# Patient Record
Sex: Female | Born: 1965 | Race: White | Hispanic: No | Marital: Married | Smoking: Never smoker
Health system: Southern US, Community
[De-identification: ages and names within clinical notes are randomized; demographics above are authoritative.]

## PROBLEM LIST (undated history)

## (undated) DIAGNOSIS — S46219A Strain of muscle, fascia and tendon of other parts of biceps, unspecified arm, initial encounter: Secondary | ICD-10-CM

## (undated) DIAGNOSIS — Z0000 Encounter for general adult medical examination without abnormal findings: Secondary | ICD-10-CM

## (undated) DIAGNOSIS — Z124 Encounter for screening for malignant neoplasm of cervix: Secondary | ICD-10-CM

## (undated) DIAGNOSIS — T7840XA Allergy, unspecified, initial encounter: Secondary | ICD-10-CM

## (undated) DIAGNOSIS — Z8619 Personal history of other infectious and parasitic diseases: Principal | ICD-10-CM

## (undated) DIAGNOSIS — E039 Hypothyroidism, unspecified: Secondary | ICD-10-CM

## (undated) DIAGNOSIS — E785 Hyperlipidemia, unspecified: Secondary | ICD-10-CM

## (undated) DIAGNOSIS — G4700 Insomnia, unspecified: Secondary | ICD-10-CM

## (undated) DIAGNOSIS — M1990 Unspecified osteoarthritis, unspecified site: Secondary | ICD-10-CM

## (undated) DIAGNOSIS — R5383 Other fatigue: Secondary | ICD-10-CM

## (undated) DIAGNOSIS — Z87898 Personal history of other specified conditions: Secondary | ICD-10-CM

## (undated) DIAGNOSIS — M25561 Pain in right knee: Secondary | ICD-10-CM

## (undated) DIAGNOSIS — M79669 Pain in unspecified lower leg: Secondary | ICD-10-CM

## (undated) DIAGNOSIS — B351 Tinea unguium: Secondary | ICD-10-CM

## (undated) DIAGNOSIS — K219 Gastro-esophageal reflux disease without esophagitis: Secondary | ICD-10-CM

## (undated) DIAGNOSIS — Z8709 Personal history of other diseases of the respiratory system: Secondary | ICD-10-CM

## (undated) DIAGNOSIS — M25562 Pain in left knee: Secondary | ICD-10-CM

## (undated) DIAGNOSIS — Z780 Asymptomatic menopausal state: Secondary | ICD-10-CM

## (undated) DIAGNOSIS — H6691 Otitis media, unspecified, right ear: Secondary | ICD-10-CM

## (undated) DIAGNOSIS — N951 Menopausal and female climacteric states: Secondary | ICD-10-CM

## (undated) HISTORY — DX: Pain in unspecified lower leg: M79.669

## (undated) HISTORY — DX: Strain of muscle, fascia and tendon of other parts of biceps, unspecified arm, initial encounter: S46.219A

## (undated) HISTORY — DX: Insomnia, unspecified: G47.00

## (undated) HISTORY — DX: Personal history of other diseases of the respiratory system: Z87.09

## (undated) HISTORY — DX: Menopausal and female climacteric states: N95.1

## (undated) HISTORY — DX: Hypothyroidism, unspecified: E03.9

## (undated) HISTORY — DX: Other fatigue: R53.83

## (undated) HISTORY — DX: Pain in left knee: M25.562

## (undated) HISTORY — DX: Encounter for screening for malignant neoplasm of cervix: Z12.4

## (undated) HISTORY — DX: Encounter for general adult medical examination without abnormal findings: Z00.00

## (undated) HISTORY — DX: Unspecified osteoarthritis, unspecified site: M19.90

## (undated) HISTORY — DX: Hyperlipidemia, unspecified: E78.5

## (undated) HISTORY — DX: Personal history of other specified conditions: Z87.898

## (undated) HISTORY — DX: Gastro-esophageal reflux disease without esophagitis: K21.9

## (undated) HISTORY — DX: Tinea unguium: B35.1

## (undated) HISTORY — DX: Allergy, unspecified, initial encounter: T78.40XA

## (undated) HISTORY — DX: Personal history of other infectious and parasitic diseases: Z86.19

## (undated) HISTORY — DX: Otitis media, unspecified, right ear: H66.91

## (undated) HISTORY — DX: Asymptomatic menopausal state: Z78.0

## (undated) HISTORY — DX: Pain in right knee: M25.561

---

## 1998-07-21 ENCOUNTER — Inpatient Hospital Stay (HOSPITAL_COMMUNITY): Admission: AD | Admit: 1998-07-21 | Discharge: 1998-07-24 | Payer: Self-pay | Admitting: Obstetrics and Gynecology

## 1998-07-26 ENCOUNTER — Encounter (HOSPITAL_COMMUNITY): Admission: RE | Admit: 1998-07-26 | Discharge: 1998-10-24 | Payer: Self-pay | Admitting: *Deleted

## 1999-03-12 ENCOUNTER — Other Ambulatory Visit: Admission: RE | Admit: 1999-03-12 | Discharge: 1999-03-12 | Payer: Self-pay | Admitting: Obstetrics and Gynecology

## 2000-03-03 ENCOUNTER — Other Ambulatory Visit: Admission: RE | Admit: 2000-03-03 | Discharge: 2000-03-03 | Payer: Self-pay | Admitting: Obstetrics and Gynecology

## 2000-10-11 ENCOUNTER — Inpatient Hospital Stay (HOSPITAL_COMMUNITY): Admission: AD | Admit: 2000-10-11 | Discharge: 2000-10-13 | Payer: Self-pay | Admitting: Obstetrics and Gynecology

## 2000-10-16 ENCOUNTER — Encounter: Admission: RE | Admit: 2000-10-16 | Discharge: 2000-11-15 | Payer: Self-pay | Admitting: Obstetrics and Gynecology

## 2002-02-05 ENCOUNTER — Other Ambulatory Visit: Admission: RE | Admit: 2002-02-05 | Discharge: 2002-02-05 | Payer: Self-pay | Admitting: Obstetrics and Gynecology

## 2003-03-10 ENCOUNTER — Other Ambulatory Visit: Admission: RE | Admit: 2003-03-10 | Discharge: 2003-03-10 | Payer: Self-pay | Admitting: Obstetrics and Gynecology

## 2003-10-19 ENCOUNTER — Inpatient Hospital Stay (HOSPITAL_COMMUNITY): Admission: AD | Admit: 2003-10-19 | Discharge: 2003-10-22 | Payer: Self-pay | Admitting: Obstetrics and Gynecology

## 2003-10-23 ENCOUNTER — Encounter: Admission: RE | Admit: 2003-10-23 | Discharge: 2003-11-22 | Payer: Self-pay | Admitting: Obstetrics and Gynecology

## 2010-11-30 ENCOUNTER — Ambulatory Visit (INDEPENDENT_AMBULATORY_CARE_PROVIDER_SITE_OTHER): Payer: BC Managed Care – PPO | Admitting: Family Medicine

## 2010-11-30 ENCOUNTER — Encounter: Payer: Self-pay | Admitting: Family Medicine

## 2010-11-30 DIAGNOSIS — G4700 Insomnia, unspecified: Secondary | ICD-10-CM

## 2010-11-30 DIAGNOSIS — M25561 Pain in right knee: Secondary | ICD-10-CM

## 2010-11-30 DIAGNOSIS — Z87898 Personal history of other specified conditions: Secondary | ICD-10-CM

## 2010-11-30 DIAGNOSIS — Z0000 Encounter for general adult medical examination without abnormal findings: Secondary | ICD-10-CM

## 2010-11-30 DIAGNOSIS — R5383 Other fatigue: Secondary | ICD-10-CM

## 2010-11-30 DIAGNOSIS — M25562 Pain in left knee: Secondary | ICD-10-CM

## 2010-11-30 DIAGNOSIS — N951 Menopausal and female climacteric states: Secondary | ICD-10-CM

## 2010-11-30 DIAGNOSIS — M2550 Pain in unspecified joint: Secondary | ICD-10-CM

## 2010-11-30 DIAGNOSIS — M25569 Pain in unspecified knee: Secondary | ICD-10-CM

## 2010-11-30 DIAGNOSIS — Z8619 Personal history of other infectious and parasitic diseases: Secondary | ICD-10-CM

## 2010-11-30 DIAGNOSIS — Z780 Asymptomatic menopausal state: Secondary | ICD-10-CM | POA: Insufficient documentation

## 2010-11-30 DIAGNOSIS — S46219A Strain of muscle, fascia and tendon of other parts of biceps, unspecified arm, initial encounter: Secondary | ICD-10-CM

## 2010-11-30 DIAGNOSIS — E663 Overweight: Secondary | ICD-10-CM

## 2010-11-30 DIAGNOSIS — Z8709 Personal history of other diseases of the respiratory system: Secondary | ICD-10-CM

## 2010-11-30 DIAGNOSIS — S43499A Other sprain of unspecified shoulder joint, initial encounter: Secondary | ICD-10-CM

## 2010-11-30 HISTORY — DX: Personal history of other specified conditions: Z87.898

## 2010-11-30 HISTORY — DX: Personal history of other infectious and parasitic diseases: Z86.19

## 2010-11-30 HISTORY — DX: Asymptomatic menopausal state: Z78.0

## 2010-11-30 HISTORY — DX: Personal history of other diseases of the respiratory system: Z87.09

## 2010-11-30 HISTORY — DX: Insomnia, unspecified: G47.00

## 2010-11-30 HISTORY — DX: Pain in left knee: M25.562

## 2010-11-30 HISTORY — DX: Other fatigue: R53.83

## 2010-11-30 HISTORY — DX: Menopausal and female climacteric states: N95.1

## 2010-11-30 HISTORY — DX: Strain of muscle, fascia and tendon of other parts of biceps, unspecified arm, initial encounter: S46.219A

## 2010-11-30 HISTORY — DX: Pain in right knee: M25.561

## 2010-11-30 MED ORDER — TEMAZEPAM 7.5 MG PO CAPS: ORAL_CAPSULE | ORAL | Status: DC

## 2010-11-30 MED ORDER — CALCIUM & MAGNESIUM CARBONATES 311-232 MG PO TABS: 1.0000 | ORAL_TABLET | Freq: Two times a day (BID) | ORAL | Status: AC

## 2010-11-30 NOTE — Assessment & Plan Note (Signed)
Patient started struggling with symptoms in her mid 30s. Has been followed by OB/GYN and had a new practitioner in [place] at the present time. They induced menses in September 2011 she then did not have another one until February 2012 and the skin came off naturally. She was having a lot of irritability and frequent hot flashes until she started with ultimate medication off by Greig Castilla less than and she says although symptoms have resolved. She may choose to switch her GYN care in the future but has not done so thus far is encouraged to try and get regular sleep which will be adjusted as well as deep small frequent meals with lean proteins and complex carbs

## 2010-11-30 NOTE — Assessment & Plan Note (Signed)
Patient has been since traveling with knee pain for the most part since December of 2011. In December she underwent a rigorous workout routine and weight loss effort actually lost quite a bit of weight but has not had improvement in the pain in her knee since that time. Of note the right is significantly worse than the left and the pain is most notable at this point anterior lesion the right knee and hurts worse with going upstairs in any other activity. She denies any obvious swelling redness or deformity she denies any obvious injury. She is in agreement to apply Aspercreme and ice twice daily take Aleve to 20 mg daily and she is referred to physical therapy for further treatment would recommend an x-ray of the right knee due to the symptoms being worse there than anywhere else

## 2010-11-30 NOTE — Progress Notes (Signed)
Kendra Bailey [id number] [birth year] 11/30/2010      Progress Note-Follow Up  Subjective  Chief Complaint  Chief Complaint  Patient presents with  . Establish Care    new patient    HPI  Patient is a 45 year old Caucasian female in today for new patient appointment. She generally is in good health but is offering some concerns today. In December of 2011 she started a vigorous workout routine antidiabetic resulted in some good weight loss. Unfortunately in the 5-6 months since then she's been struggling with persistent right bicep pain and bilateral knee pain right worse then left. She denies ever noting any redness, swelling, acute injury, deformity. She is less sore right bicep the pain" this appears never fully disappeared. She did use Aleve twice daily 2 tablets at a time for 10 days and approximately temporally only to have the pain occur. The right knee is worse when going up stairs and going down or with any other activity and the pain is located largely over the patella and the more superficial level at this point previously felt somewhat deeper. She is interested in further evaluation at this time. She mostly complains of poor sleep. She reports she falls asleep well but has trouble maintaining sleep generally waking up at 3 AM and approximately 5 AM. She tried melatonin valerian root but only has minimal response. Lunesta with a sour metallic taste in her mouth she did not like it. Of note her 22 year old mother who is not significantly overweight does struggle with severe enough apnea 2 resulting cardiomegaly and possibly valvular heart disease. Patient herself denies snoring or waking up with headaches. She reports being perimenopausal presently sees OB/GYN [place]. They actually induced a period in September 2011 and she did not have another one done February 2012 which he naturally has not had one since she was struggling with hot flashes and irritability but began an ultimate  women's vitamin and her symptoms greatly improved.  Past Medical History  Diagnosis Date  . History of chicken pox 11/30/2010  . History of strep sore throat 11/30/2010  . Perimenopause 11/30/2010  . Insomnia 11/30/2010  . Fatigue 11/30/2010  . Biceps strain 11/30/2010  . Knee pain, bilateral 11/30/2010  . History of fibrocystic disease of breast 11/30/2010    History reviewed. No pertinent past surgical history.  Family History  Problem Relation Age of Onset  . Emphysema Father     smoker  . COPD Father 23    emphysema, smoker  . Hyperlipidemia Father   . Multiple sclerosis Sister   . ADD / ADHD Son     ADHD  . Hypertension Maternal Grandmother   . Alzheimer's disease Maternal Grandmother   . Cancer Paternal Grandmother   . Alzheimer's disease Paternal Grandfather   . Heart disease Mother     cardiomegaly, possible valvular insufficiencyt  . Hyperlipidemia Mother   . Hypertension Mother     History   Social History  . Marital Status: Single    Spouse Name: N/A    Number of Children: N/A  . Years of Education: N/A   Occupational History  . Not on file.   Social History Main Topics  . Smoking status: Never Smoker   . Smokeless tobacco: Never Used  . Alcohol Use: Yes     occasional- wine  . Drug Use: No  . Sexually Active: Yes   Other Topics Concern  . Not on file   Social History Narrative  . No narrative on file  No current outpatient prescriptions on file prior to visit.    Allergies  Allergen Reactions  . Penicillins Rash    Review of Systems  Review of Systems  Constitutional: Negative for fever, chills and malaise/fatigue.  HENT: Negative for hearing loss, nosebleeds and congestion.   Eyes: Negative for discharge.  Respiratory: Negative for cough, sputum production, shortness of breath and wheezing.   Cardiovascular: Negative for chest pain, palpitations and leg swelling.  Gastrointestinal: Negative for heartburn, nausea, vomiting, abdominal pain,  diarrhea, constipation and blood in stool.  Genitourinary: Negative for dysuria, urgency, frequency and hematuria.  Musculoskeletal: Positive for myalgias and joint pain. Negative for back pain and falls.       [Patient c/o b/l knee pain for 5-6 months now, minimal response to OTC meds and rest. R>L. C/O right biceps muscle being sore for 5-6 months since she started a vigorous exercise regimen. She does see some improvement when rests ut Skin: Negative for rash.  Neurological: Negative for dizziness, tremors, sensory change, focal weakness, loss of consciousness, weakness and headaches.  Endo/Heme/Allergies: Negative for polydipsia. Does not bruise/bleed easily.  Psychiatric/Behavioral: Negative for depression and suicidal ideas. The patient is not nervous/anxious and does not have insomnia.     Objective  BP 114/77  Pulse 73  Temp(Src) 98 F (36.7 C) (Oral)  Ht 5\' 5"  (1.651 m)  Wt 156 lb 1.9 oz (70.816 kg)  BMI 25.98 kg/m2  SpO2 98%  Physical Exam  Physical Exam  Constitutional: She is oriented to person, place, and time and well-developed, well-nourished, and in no distress. No distress.  HENT:  Head: Normocephalic and atraumatic.  Right Ear: External ear normal.  Left Ear: External ear normal.  Nose: Nose normal.  Mouth/Throat: Oropharynx is clear and moist. No oropharyngeal exudate.  Eyes: Conjunctivae are normal. Pupils are equal, round, and reactive to light. Right eye exhibits no discharge. Left eye exhibits no discharge. No scleral icterus.  Neck: Normal range of motion. Neck supple. No thyromegaly present.  Cardiovascular: Normal rate, regular rhythm, normal heart sounds and intact distal pulses.   No murmur heard. Pulmonary/Chest: Effort normal and breath sounds normal. No respiratory distress. She has no wheezes. She has no rales.  Abdominal: Soft. Bowel sounds are normal. She exhibits no distension and no mass. There is no tenderness.  Musculoskeletal: Normal range  of motion. She exhibits no edema and no tenderness.  Lymphadenopathy:    She has no cervical adenopathy.  Neurological: She is alert and oriented to person, place, and time. She has normal reflexes. No cranial nerve deficit. Coordination normal.  Skin: Skin is warm and dry. No rash noted. She is not diaphoretic.  Psychiatric: Mood, memory and affect normal.    No results found for this basename: TSH   No results found for this basename: WBC, HGB, HCT, MCV, PLT   No results found for this basename: CREATININE, BUN, NA, K, CL, CO2   No results found for this basename: ALT, AST, GGT, ALKPHOS, BILITOT   No results found for this basename: CHOL   No results found for this basename: HDL   No results found for this basename: LDLCALC   No results found for this basename: TRIG   No results found for this basename: CHOLHDL     Assessment & Plan  Perimenopause Patient started struggling with symptoms in her mid 30s. Has been followed by OB/GYN and had a new practitioner in [place] at the present time. They induced menses in September 2011 she  then did not have another one until February 2012 and the skin came off naturally. She was having a lot of irritability and frequent hot flashes until she started with ultimate medication off by Greig Castilla less than and she says although symptoms have resolved. She may choose to switch her GYN care in the future but has not done so thus far is encouraged to try and get regular sleep which will be adjusted as well as deep small frequent meals with lean proteins and complex carbs  Knee pain, bilateral Patient has been since traveling with knee pain for the most part since December of 2011. In December she underwent a rigorous workout routine and weight loss effort actually lost quite a bit of weight but has not had improvement in the pain in her knee since that time. Of note the right is significantly worse than the left and the pain is most notable at  this point anterior lesion the right knee and hurts worse with going upstairs in any other activity. She denies any obvious swelling redness or deformity she denies any obvious injury. She is in agreement to apply Aspercreme and ice twice daily take Aleve to 20 mg daily and she is referred to physical therapy for further treatment would recommend an x-ray of the right knee due to the symptoms being worse there than anywhere else  Insomnia Patient with long history of toe with insomnia. She reports roughly 5 years. She generally falls asleep without difficulty but wakes up routinely at roughly 3:00 in the morning and at what time she goes to bed. She didn't have trouble falling asleep for good 2 hours. As a result she is chronically fatigued. She is trying Lunesta past and had a metallic taste would not like to use that again. She denies any snoring. She does use melatonin n intermittently with only minimal effect. Of note her mother is only in her mid 29s and does have sleep apnea which was severe enough to cause some cardiomegaly prior to her diagnosis. At this point the patient has just been off we will start Restoril 7.5 mg 1-2 caps by mouth each bedtime she is counseled regarding good sleep hygiene including a dark quiet room no strenuous activity or stimulants in the evenings. She is further referred for a evaluation by sleep medicine and the possibility of a sleep study.  Fatigue Likely multifactorial but certainly is related to stress and poor sleep. We will evaluate her sleep and she is encouraged to eat small frequent meals with lean proteins complex carbs and to exercise on a regular basis. Check lab work including TSH and CBC for further information  Biceps strain Encouraged, Aleve daily, ice and Aspercreme bid and referred to PT for rehab and consideration of Iontophorsis.

## 2010-11-30 NOTE — Patient Instructions (Signed)
Knee Pain, General The knee is the complex joint between your thigh and your lower leg. It is made up of bones, tendons, ligaments, and cartilage. The bones that make up the knee are:  The femur in the thigh.   The tibia and fibula in the lower leg.   The patella or kneecap riding in the groove on the lower femur.  CAUSES Knee pain is a common complaint with many causes.  A few of these causes are:  Injury, such as:   A ruptured ligament or tendon injury.   Torn cartilage.    Medical conditions, such as:   Gout   Arthritis   Infections   Overuse, over training or overdoing a physical activity.  Knee pain can be minor or severe. Knee pain can accompany debilitating injury. Minor knee problems often respond well to self-care measures or get well on their own. More serious injuries may need medical intervention or even surgery. Symptoms The knee is complex. Symptoms of knee problems can vary widely. Some of the problems are:  Pain with movement and weight bearing.  Swelling and tenderness.   Buckling of the knee.   Inability to straighten or extend your knee.   Your knee locks and you cannot straighten it.  Warmth and redness with pain and fever.   Deformity or dislocation of the kneecap.   DIAGNOSIS Determining what is wrong may be very straight forward such as when there is an injury. It can also be challenging because of the complexity of the knee. Tests to make a diagnosis may include:  Your caregiver taking a history and doing a physical exam.   Routine X-rays can be used to rule out other problems. X-rays will not reveal a cartilage tear. Some injuries of the knee can be diagnosed by:   Arthroscopy a surgical technique by which a small video camera is inserted through tiny incisions on the sides of the knee. This procedure is used to examine and repair internal knee joint problems. Tiny instruments can be used during arthroscopy to repair the torn knee cartilage  (meniscus).   Arthrography is a radiology technique. A contrast liquid is directly injected into the knee joint. Internal structures of the knee joint then become visible on X-ray film.   An MRI scan is a non x-ray radiology procedure in which magnetic fields and a computer produce two- or three-dimensional images of the inside of the knee. Cartilage tears are often visible using an MRI scanner. MRI scans have largely replaced arthrography in diagnosing cartilage tears of the knee.   Blood work.   Examination of the fluid that helps to lubricate the knee joint (synovial fluid). This is done by taking a sample out using a needle and a syringe.  treatment The treatment of knee problems depends on the cause. Some of these treatments are:  Depending on the injury, proper casting, splinting, surgery or physical therapy care will be needed.   Give yourself adequate recovery time. Do not overuse your joints. If you begin to get sore during workout routines, back off. Slow down or do fewer repetitions.   For repetitive activities such as cycling or running, maintain your strength and nutrition.   Alternate muscle groups. For example if you are a weight lifter, work the upper body on one day and the lower body the next.   Either tight or weak muscles do not give the proper support for your knee. Tight or weak muscles do not absorb the stress placed   on the knee joint. Keep the muscles surrounding the knee strong.   Take care of mechanical problems.   If you have flat feet, orthotics or special shoes may help. See your caregiver if you need help.   Arch supports, sometimes with wedges on the inner or outer aspect of the heel, can help. These can shift pressure away from the side of the knee most bothered by osteoarthritis.   A brace called an "unloader" brace also may be used to help ease the pressure on the most arthritic side of the knee.   If your caregiver has prescribed crutches, braces,  wraps or ice, use as directed. The acronym for this is PRICE. This means protection, rest, ice, compression and elevation.   Nonsteroidal anti-inflammatory drugs (NSAID's), can help relieve pain. But if taken immediately after an injury, they may actually increase swelling. Take NSAID's with food in your stomach. Stop them if you develop stomach problems. Do not take these if you have a history of ulcers, stomach pain or bleeding from the bowel. Do not take without your caregiver's approval if you have problems with fluid retention, heart failure, or kidney problems.   For ongoing knee problems, physical therapy may be helpful.   Glucosamine and chondroitin are over-the-counter dietary supplements. Both may help relieve the pain of osteoarthritis in the knee. These medicines are different from the usual anti-inflammatory drugs. Glucosamine may decrease the rate of cartilage destruction.   Injections of a corticosteroid drug into your knee joint may help reduce the symptoms of an arthritis flare-up. They may provide pain relief that lasts a few months. You may have to wait a few months between injections. The injections do have a small increased risk of infection, water retention and elevated blood sugar levels.   Hyaluronic acid injected into damaged joints may ease pain and provide lubrication. These injections may work by reducing inflammation. A series of shots may give relief for as long as 6 months.   Topical painkillers. Applying certain ointments to your skin may help relieve the pain and stiffness of osteoarthritis. Ask your pharmacist for suggestions. Many over the-counter products are approved for temporary relief of arthritis pain.   In some countries, doctors often prescribe topical NSAID's for relief of chronic conditions such as arthritis and tendinitis. A review of treatment with NSAID creams found that they worked as well as oral medications but without the serious side effects.    Prevention  Maintain a healthy weight. Extra pounds put more strain on your joints.   Get strong, stay limber. Weak muscles are a common cause of knee injuries. Stretching is important. Include flexibility exercises in your workouts.   Be smart about exercise. If you have osteoarthritis, chronic knee pain or recurring injuries, you may need to change the way you exercise. This does not mean you have to stop being active. If your knees ache after jogging or playing basketball, consider switching to swimming, water aerobics or other low-impact activities, at least for a few days a week. Sometimes limiting high-impact activities will provide relief.   Make sure your shoes fit well. Choose footwear that is right for your sport.   Protect your knees. Use the proper gear for knee-sensitive activities. Use kneepads when playing volleyball or laying carpet. Buckle your seat belt every time you drive. Most shattered kneecaps occur in car accidents.   Rest when you are tired.  SEEK MEDICAL CARE IF: You have knee pain that is continual and does not seem   to be getting better.  Seek IMMEDIATE MEDICAL CARE IF:  Your knee joint feels hot to the touch and you have a high fever. MAKE SURE YOU:   Understand these instructions.   Will watch your condition.   Will get help right away if you are not doing well or get worse.  Document Released: 05/08/2007 Document Re-Released: 10/05/2009 [facility] Patient Information 2011 [place], [place].  Ice and Aspercreme up to twice a day   Start Aleve 220mg  1 tab daily with food for next 2 weeks  Will refer to PT in [facility] for eval  Will refer to Sleep specialist for insomnia

## 2010-11-30 NOTE — Assessment & Plan Note (Signed)
Patient with long history of toe with insomnia. She reports roughly 5 years. She generally falls asleep without difficulty but wakes up routinely at roughly 3:00 in the morning and at what time she goes to bed. She didn't have trouble falling asleep for good 2 hours. As a result she is chronically fatigued. She is trying Lunesta past and had a metallic taste would not like to use that again. She denies any snoring. She does use melatonin n intermittently with only minimal effect. Of note her mother is only in her mid 35s and does have sleep apnea which was severe enough to cause some cardiomegaly prior to her diagnosis. At this point the patient has just been off we will start Restoril 7.5 mg 1-2 caps by mouth each bedtime she is counseled regarding good sleep hygiene including a dark quiet room no strenuous activity or stimulants in the evenings. She is further referred for a evaluation by sleep medicine and the possibility of a sleep study.

## 2010-11-30 NOTE — Assessment & Plan Note (Signed)
Likely multifactorial but certainly is related to stress and poor sleep. We will evaluate her sleep and she is encouraged to eat small frequent meals with lean proteins complex carbs and to exercise on a regular basis. Check lab work including TSH and CBC for further information

## 2010-11-30 NOTE — Assessment & Plan Note (Signed)
Encouraged, Aleve daily, ice and Aspercreme bid and referred to PT for rehab and consideration of Iontophorsis.

## 2010-12-01 ENCOUNTER — Other Ambulatory Visit (INDEPENDENT_AMBULATORY_CARE_PROVIDER_SITE_OTHER): Payer: BC Managed Care – PPO

## 2010-12-01 DIAGNOSIS — G4700 Insomnia, unspecified: Secondary | ICD-10-CM

## 2010-12-01 DIAGNOSIS — E663 Overweight: Secondary | ICD-10-CM

## 2010-12-01 DIAGNOSIS — M2550 Pain in unspecified joint: Secondary | ICD-10-CM

## 2010-12-01 DIAGNOSIS — R5383 Other fatigue: Secondary | ICD-10-CM

## 2010-12-01 DIAGNOSIS — M25561 Pain in right knee: Secondary | ICD-10-CM

## 2010-12-01 DIAGNOSIS — M25562 Pain in left knee: Secondary | ICD-10-CM

## 2010-12-01 LAB — HEPATIC FUNCTION PANEL
ALT: 22 U/L (ref 0–35)
Alkaline Phosphatase: 51 U/L (ref 39–117)
Bilirubin, Direct: 0.1 mg/dL (ref 0.0–0.3)
Total Bilirubin: 1 mg/dL (ref 0.3–1.2)

## 2010-12-01 LAB — RENAL FUNCTION PANEL
Calcium: 9.2 mg/dL (ref 8.4–10.5)
Chloride: 103 mEq/L (ref 96–112)
Glucose, Bld: 78 mg/dL (ref 70–99)
Phosphorus: 3.6 mg/dL (ref 2.3–4.6)
Potassium: 4.7 mEq/L (ref 3.5–5.1)
Sodium: 139 mEq/L (ref 135–145)

## 2010-12-01 LAB — CBC WITH DIFFERENTIAL/PLATELET
Basophils Relative: 0.4 % (ref 0.0–3.0)
Eosinophils Absolute: 0.1 10*3/uL (ref 0.0–0.7)
Hemoglobin: 15.4 g/dL — ABNORMAL HIGH (ref 12.0–15.0)
MCHC: 35 g/dL (ref 30.0–36.0)
MCV: 90.3 fl (ref 78.0–100.0)
Monocytes Absolute: 0.4 10*3/uL (ref 0.1–1.0)
Neutro Abs: 3.1 10*3/uL (ref 1.4–7.7)
RBC: 4.88 Mil/uL (ref 3.87–5.11)
RDW: 12.5 % (ref 11.5–14.6)

## 2010-12-02 ENCOUNTER — Ambulatory Visit (HOSPITAL_BASED_OUTPATIENT_CLINIC_OR_DEPARTMENT_OTHER)
Admission: RE | Admit: 2010-12-02 | Discharge: 2010-12-02 | Disposition: A | Discharge status: Home / Self Care | Payer: BC Managed Care – PPO | Source: Ambulatory Visit | Attending: Family Medicine | Admitting: Family Medicine

## 2010-12-02 DIAGNOSIS — M25569 Pain in unspecified knee: Secondary | ICD-10-CM

## 2010-12-02 LAB — ANA: Anti Nuclear Antibody(ANA): NEGATIVE

## 2010-12-07 ENCOUNTER — Ambulatory Visit: Payer: Self-pay | Admitting: Family Medicine

## 2010-12-15 ENCOUNTER — Ambulatory Visit (INDEPENDENT_AMBULATORY_CARE_PROVIDER_SITE_OTHER): Payer: BC Managed Care – PPO | Admitting: Pulmonary Disease

## 2010-12-15 ENCOUNTER — Encounter: Payer: Self-pay | Admitting: Pulmonary Disease

## 2010-12-15 VITALS — BP 100/60 | HR 68 | Temp 97.9°F | Ht 65.0 in | Wt 159.0 lb

## 2010-12-15 DIAGNOSIS — G4700 Insomnia, unspecified: Secondary | ICD-10-CM

## 2010-12-15 NOTE — Progress Notes (Signed)
  Subjective:    Patient ID: Kendra Bailey, female    DOB: 1965-10-10, 45 y.o.   MRN: [id number]  HPI The pt is a 45y/o female who I have been asked to see for ongoing sleeping issues.  The pt describes a 45yr h/o sleep maintenance problems.  She typically goes to bed btw 845-945pm, and will usually watch tv or read in bed.  When she does turn out the lights, usually around 10pm, she gets to sleep very quickly.  She will sleep for 3-4 hrs, but then awaken, and be "totally awake with mind racing".  She will usually stay in bed and toss and turn, and it will take her to 2 hrs to fall back asleep.  She denies any snoring issues or symptoms c/w SDB, and does not have any leg jerks or RLS sx.  She will start her day around 630am, and does not feel rested upon arising.  She does not feel sleepy during the day, but rather describes fatigue/tiredness.  The pt states her weight has been up and down the last 2 yrs, but that she is net up 20lbs.     Review of Systems  Constitutional: Positive for unexpected weight change. Negative for fever.  HENT: Negative for ear pain, nosebleeds, congestion, sore throat, rhinorrhea, sneezing, trouble swallowing, dental problem, postnasal drip and sinus pressure.   Eyes: Negative for redness and itching.  Respiratory: Negative for cough, chest tightness, shortness of breath and wheezing.   Cardiovascular: Negative for palpitations and leg swelling.  Gastrointestinal: Negative for nausea and vomiting.  Genitourinary: Negative for dysuria.  Musculoskeletal: Positive for joint swelling.  Skin: Negative for rash.  Neurological: Negative for headaches.  Hematological: Does not bruise/bleed easily.  Psychiatric/Behavioral: Negative for dysphoric mood. The patient is not nervous/anxious.        Objective:   Physical Exam Constitutional:  Well developed, no acute distress  HENT:  Nares patent without discharge  Oropharynx without exudate, palate and uvula are  normal  Eyes:  Perrla, eomi, no scleral icterus  Neck:  No JVD, no TMG  Cardiovascular:  Normal rate, regular rhythm, no rubs or gallops.  No murmurs        Intact distal pulses  Pulmonary :  Normal breath sounds, no stridor or respiratory distress   No rales, rhonchi, or wheezing  Abdominal:  Soft, nondistended, bowel sounds present.  No tenderness noted.   Musculoskeletal:  No lower extremity edema noted.  Lymph Nodes:  No cervical lymphadenopathy noted  Skin:  No cyanosis noted  Neurologic:  Alert, appropriate, does not appear sleepy,  moves all 4 extremities without obvious deficit.         Assessment & Plan:

## 2010-12-15 NOTE — Patient Instructions (Signed)
Do not read or watch tv in bed.  Go to bed with the intent of going to sleep immediately Do not stay in bed more than if you cannot initiate sleep. No napping during the day, and no caffeine after 10am. Consider moving bedtime to 11pm until this issue improves. followup with me in 3 weeks to check on your progress.

## 2010-12-24 NOTE — Assessment & Plan Note (Signed)
The pt is describing sleep maintenance issues that I suspect are related to sleep hygiene, although cannot exclude an advanced sleep phase problem.  I think she needs to work on getting to bed later, establishing the bedroom as an environment for sleeping only, and to try stimulus control therapy if she cannot get back to sleep quickly.  I have reviewed this with her in detail.

## 2011-01-10 ENCOUNTER — Ambulatory Visit: Payer: BC Managed Care – PPO | Admitting: Pulmonary Disease

## 2011-08-12 ENCOUNTER — Encounter: Payer: Self-pay | Admitting: Family Medicine

## 2011-08-12 ENCOUNTER — Ambulatory Visit (INDEPENDENT_AMBULATORY_CARE_PROVIDER_SITE_OTHER): Payer: 59 | Admitting: Family Medicine

## 2011-08-12 VITALS — BP 131/75 | HR 66 | Temp 97.8°F | Ht 65.0 in | Wt 167.8 lb

## 2011-08-12 DIAGNOSIS — M79669 Pain in unspecified lower leg: Secondary | ICD-10-CM

## 2011-08-12 DIAGNOSIS — Z23 Encounter for immunization: Secondary | ICD-10-CM

## 2011-08-12 DIAGNOSIS — S46219A Strain of muscle, fascia and tendon of other parts of biceps, unspecified arm, initial encounter: Secondary | ICD-10-CM

## 2011-08-12 DIAGNOSIS — S46819A Strain of other muscles, fascia and tendons at shoulder and upper arm level, unspecified arm, initial encounter: Secondary | ICD-10-CM

## 2011-08-12 DIAGNOSIS — M25519 Pain in unspecified shoulder: Secondary | ICD-10-CM

## 2011-08-12 DIAGNOSIS — M79609 Pain in unspecified limb: Secondary | ICD-10-CM

## 2011-08-12 DIAGNOSIS — M25511 Pain in right shoulder: Secondary | ICD-10-CM

## 2011-08-12 HISTORY — DX: Pain in unspecified lower leg: M79.669

## 2011-08-12 NOTE — Progress Notes (Signed)
Patient ID: Kendra Bailey, female   DOB: October 23, 1965, 46 y.o.   MRN: [id number] RMONI KEPLINGER [id number] 1965-12-11 08/12/2011      Progress Note-Follow Up  Subjective  Chief Complaint  Chief Complaint  Patient presents with  . pain in calf muscle    right- X 1 yr ago- hurts while working out but ok while walking    HPI  Patient is a 78 Caucasian female who is in today complaining of recurrent right shoulder pain. After she was in and was having pain diagnosed with biceps tendinitis and underwent a course of physical therapy which did help. The pain resolved but then he should return to exercise the pain returns. It is not debilitating but it does occur to him comfortable. There is no redness or warmth. She's had recent injury. No numbness tingling or weakness in the hand. She also complaining of intermittent right calf pain which has been going on for about a year. She reports doing step aerobics last year and stepping up without twisting or falling and having a pain in her right posterior calf. Never any redness, swelling, warmth.  With rest this pain also resolved but has recurred once again and she started to exercise. No shortness of breath, chest pain, palpitations, symptoms radiating up or down the leg or into the foot.  Past Medical History  Diagnosis Date  . History of chicken pox 11/30/2010  . History of strep sore throat 11/30/2010  . Perimenopause 11/30/2010  . Insomnia 11/30/2010  . Fatigue 11/30/2010  . Biceps strain 11/30/2010  . Knee pain, bilateral 11/30/2010  . History of fibrocystic disease of breast 11/30/2010  . Calf pain 08/12/2011    History reviewed. No pertinent past surgical history.  Family History  Problem Relation Age of Onset  . Emphysema Father     smoker  . COPD Father 6    emphysema, smoker  . Hyperlipidemia Father   . Multiple sclerosis Sister   . ADD / ADHD Son     ADHD  . Hypertension Maternal Grandmother   . Alzheimer's disease Maternal  Grandmother   . Cancer Paternal Grandmother   . Alzheimer's disease Paternal Grandfather   . Heart disease Mother     cardiomegaly, possible valvular insufficiencyt  . Hyperlipidemia Mother   . Hypertension Mother     History   Social History  . Marital Status: Married    Spouse Name: N/A    Number of Children: 3  . Years of Education: N/A   Occupational History  . stay at home mom    Social History Main Topics  . Smoking status: Never Smoker   . Smokeless tobacco: Never Used  . Alcohol Use: Yes     occasional- wine  . Drug Use: No  . Sexually Active: Yes   Other Topics Concern  . Not on file   Social History Narrative  . No narrative on file    Current Outpatient Prescriptions on File Prior to Visit  Medication Sig Dispense Refill  . calcium & magnesium carbonates (MYLANTA) 311-232 MG per tablet Take 1 tablet by mouth 2 (two) times daily.  60 tablet  11  . fish oil-omega-3 fatty acids 1000 MG capsule Take 2 g by mouth daily.        . multivitamin (THERAGRAN) tablet Take 1 tablet by mouth daily.          Allergies  Allergen Reactions  . Penicillins Rash    Review of Systems  Review  of Systems  Constitutional: Negative for fever and malaise/fatigue.  HENT: Negative for congestion.   Eyes: Negative for discharge.  Respiratory: Negative for shortness of breath.   Cardiovascular: Negative for chest pain, palpitations and leg swelling.  Gastrointestinal: Negative for nausea, abdominal pain and diarrhea.  Genitourinary: Negative for dysuria.  Musculoskeletal: Positive for myalgias and joint pain. Negative for falls.       Recurrent right shoulder pain, recurrent right calf pain  Skin: Negative for rash.  Neurological: Negative for loss of consciousness and headaches.  Endo/Heme/Allergies: Negative for polydipsia.  Psychiatric/Behavioral: Negative for depression and suicidal ideas. The patient is not nervous/anxious and does not have insomnia.      Objective  BP 131/75  Pulse 66  Temp(Src) 97.8 F (36.6 C) (Temporal)  Ht 5\' 5"  (1.651 m)  Wt 167 lb 12.8 oz (76.114 kg)  BMI 27.92 kg/m2  SpO2 100%  Physical Exam  Physical Exam  Constitutional: She is oriented to person, place, and time and well-developed, well-nourished, and in no distress. No distress.  HENT:  Head: Normocephalic and atraumatic.  Eyes: Conjunctivae are normal.  Neck: Neck supple. No thyromegaly present.  Cardiovascular: Normal rate, regular rhythm and normal heart sounds.   No murmur heard. Pulmonary/Chest: Effort normal and breath sounds normal. She has no wheezes.  Abdominal: She exhibits no distension and no mass.  Musculoskeletal: She exhibits tenderness. She exhibits no edema.       Slightly tender with palpation over lateral right shoulder and posterior right calf  Lymphadenopathy:    She has no cervical adenopathy.  Neurological: She is alert and oriented to person, place, and time.  Skin: Skin is warm and dry. No rash noted. She is not diaphoretic.  Psychiatric: Memory, affect and judgment normal.    Lab Results  Component Value Date   TSH 2.41 12/01/2010   Lab Results  Component Value Date   WBC 5.4 12/01/2010   HGB 15.4* 12/01/2010   HCT 44.0 12/01/2010   MCV 90.3 12/01/2010   PLT 215.0 12/01/2010   Lab Results  Component Value Date   CREATININE 1.0 12/01/2010   BUN 17 12/01/2010   NA 139 12/01/2010   K 4.7 12/01/2010   CL 103 12/01/2010   CO2 31 12/01/2010   Lab Results  Component Value Date   ALT 22 12/01/2010   AST 28 12/01/2010   ALKPHOS 51 12/01/2010   BILITOT 1.0 12/01/2010      Assessment & Plan  Calf pain Recurrent since an injury during step aerobics about a year ago.He see twisting or falls when it first occurred or since. There was no redness or warmth or swelling occurred or now. She notes it is better whenever she stops exercising but when she restarts the pain recurs. It hurts with excess use any drops at night. She is requesting  further evaluation. She is referred to orthopedics at this time due to recurrent nature of injury.   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 She describes is on the lateral shoulder and worse with use. No numbness tingling or weakness in the arm. Proximal and the rest are helpful. At this point we will refer her to orthopedics for further evaluation at this time. She is encouraged to continue stretching and exercise as tolerated. May continue naproxen which does give her temporary relief he may also try a Aspercreme twice a day.

## 2011-08-12 NOTE — Assessment & Plan Note (Signed)
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 She describes is on the lateral shoulder and worse with use. No numbness tingling or weakness in the arm. Proximal and the rest are helpful. At this point we will refer her to orthopedics for further evaluation at this time. She is encouraged to continue stretching and exercise as tolerated. May continue naproxen which does give her temporary relief he may also try a Aspercreme twice a day.

## 2011-08-12 NOTE — Patient Instructions (Signed)
Shoulder Pain The shoulder is a ball and socket joint. The muscles and tendons (rotator cuff) are what keep the shoulder in its joint and stable. This collection of muscles and tendons holds in the head (ball) of the humerus (upper arm bone) in the fossa (cup) of the scapula (shoulder blade). Today no reason was found for your shoulder pain. Often pain in the shoulder may be treated conservatively with temporary immobilization. For example, holding the shoulder in one place using a sling for rest. Physical therapy may be needed if problems continue. HOME CARE INSTRUCTIONS   Apply ice to the sore area for 15 to 20 minutes, 3 to 4 times per day for the first 2 days. Put the ice in a plastic bag. Place a towel between the bag of ice and your skin.   If you have or were given a shoulder sling and straps, do not remove for as long as directed by your   caregiver or until you see a caregiver for a follow-up examination. If you need to remove it to shower or bathe, move your arm as little as possible.   Sleep on several pillows at night to lessen swelling and pain.   Only take over-the-counter or prescription medicines for pain, discomfort, or fever as directed by your caregiver.   Keep any follow-up appointments in order to avoid any type of permanent shoulder disability or chronic pain problems.  SEEK MEDICAL CARE IF:   Pain in your shoulder increases or new pain develops in your arm, hand, or fingers.   Your hand or fingers are colder than your other hand.   You do not obtain pain relief with the medications or your pain becomes worse.  SEEK IMMEDIATE MEDICAL CARE IF:   Your arm, hand, or fingers are numb or tingling.   Your arm, hand, or fingers are swollen, painful, or turn white or blue.   You develop chest pain or shortness of breath.  MAKE SURE YOU:   Understand these instructions.   Will watch your condition.   Will get help right away if you are not doing well or get worse.    Document Released: 04/20/2005 Document Revised: 03/23/2011 Document Reviewed: 11/23/2006  Continue to stretch daily and try Aspercreme twice daily [facility] Patient Information 2012 [place], [place].

## 2011-08-12 NOTE — Assessment & Plan Note (Signed)
Recurrent since an injury during step aerobics about a year ago.He see twisting or falls when it first occurred or since. There was no redness or warmth or swelling occurred or now. She notes it is better whenever she stops exercising but when she restarts the pain recurs. It hurts with excess use any drops at night. She is requesting further evaluation. She is referred to orthopedics at this time due to recurrent nature of injury.

## 2012-03-18 IMAGING — CR DG KNEE 1-2V*R*
2 series · 2 of 2 positions shown · non-contrast
Comparison: None.

CLINICAL DATA: Chronic anterior knee pain.  No specific injury
history.  .

RIGHT KNEE - 1-2 VIEW

[t knee ap right]
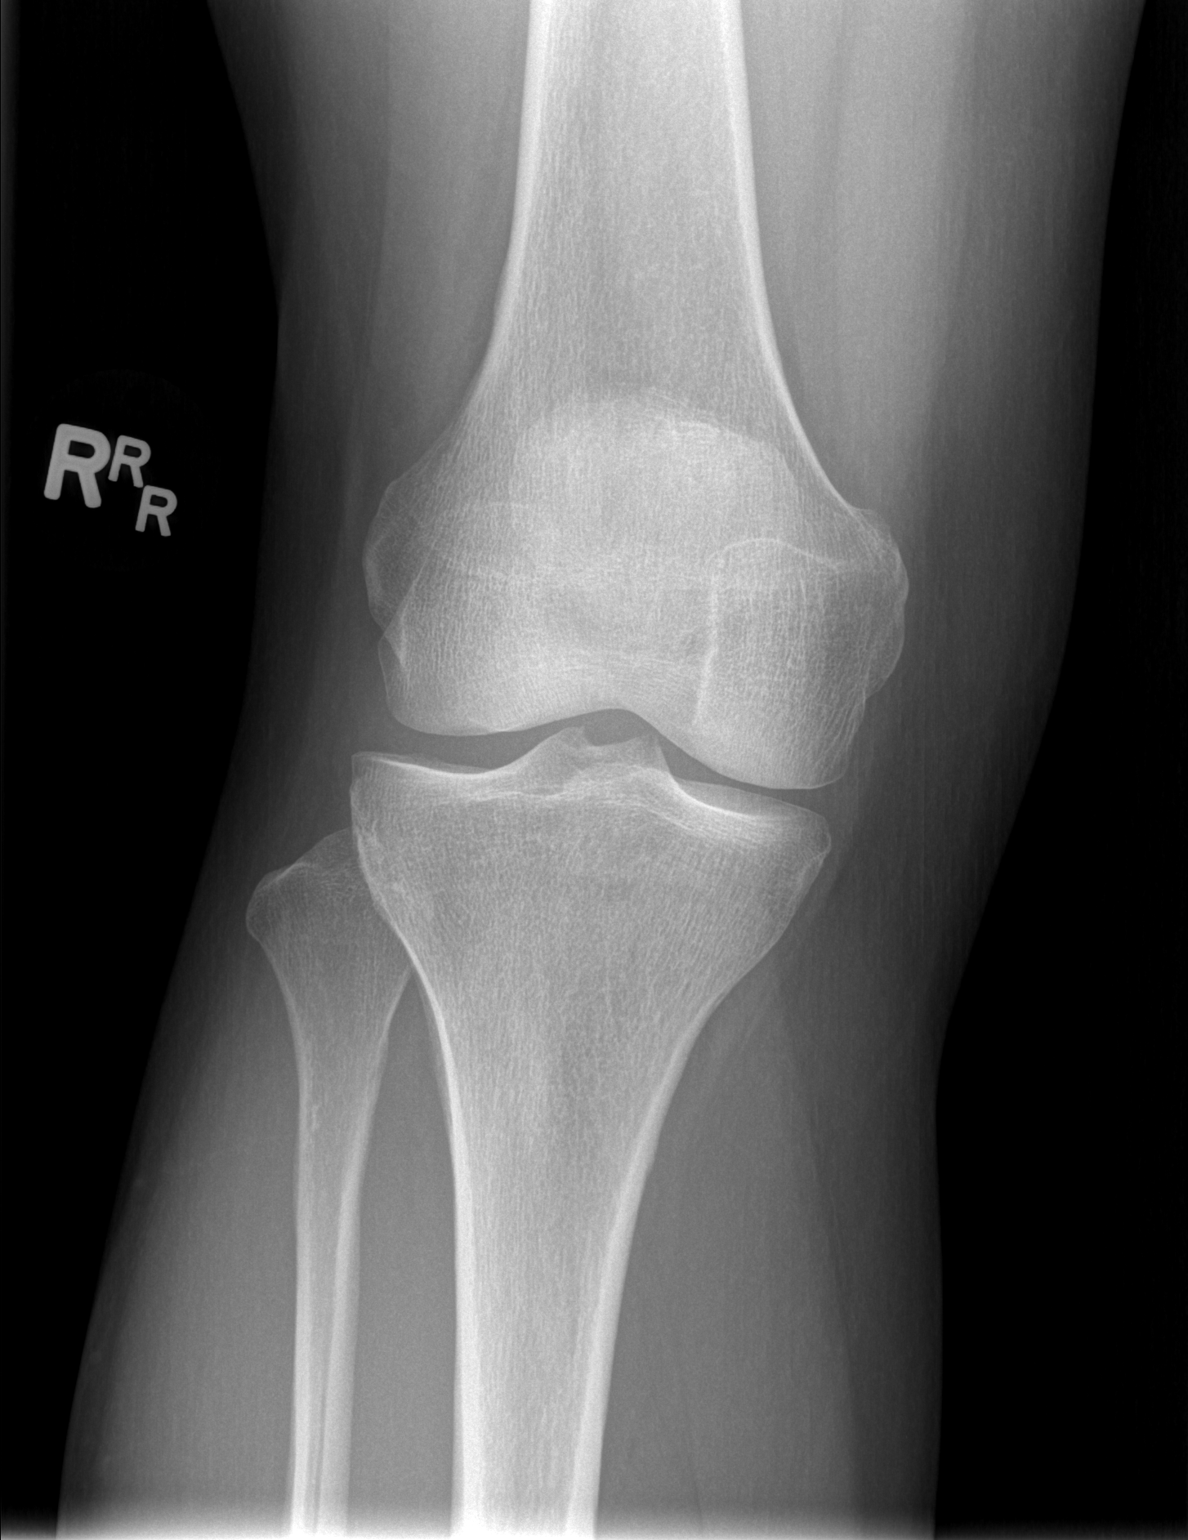

[t knee lat right]
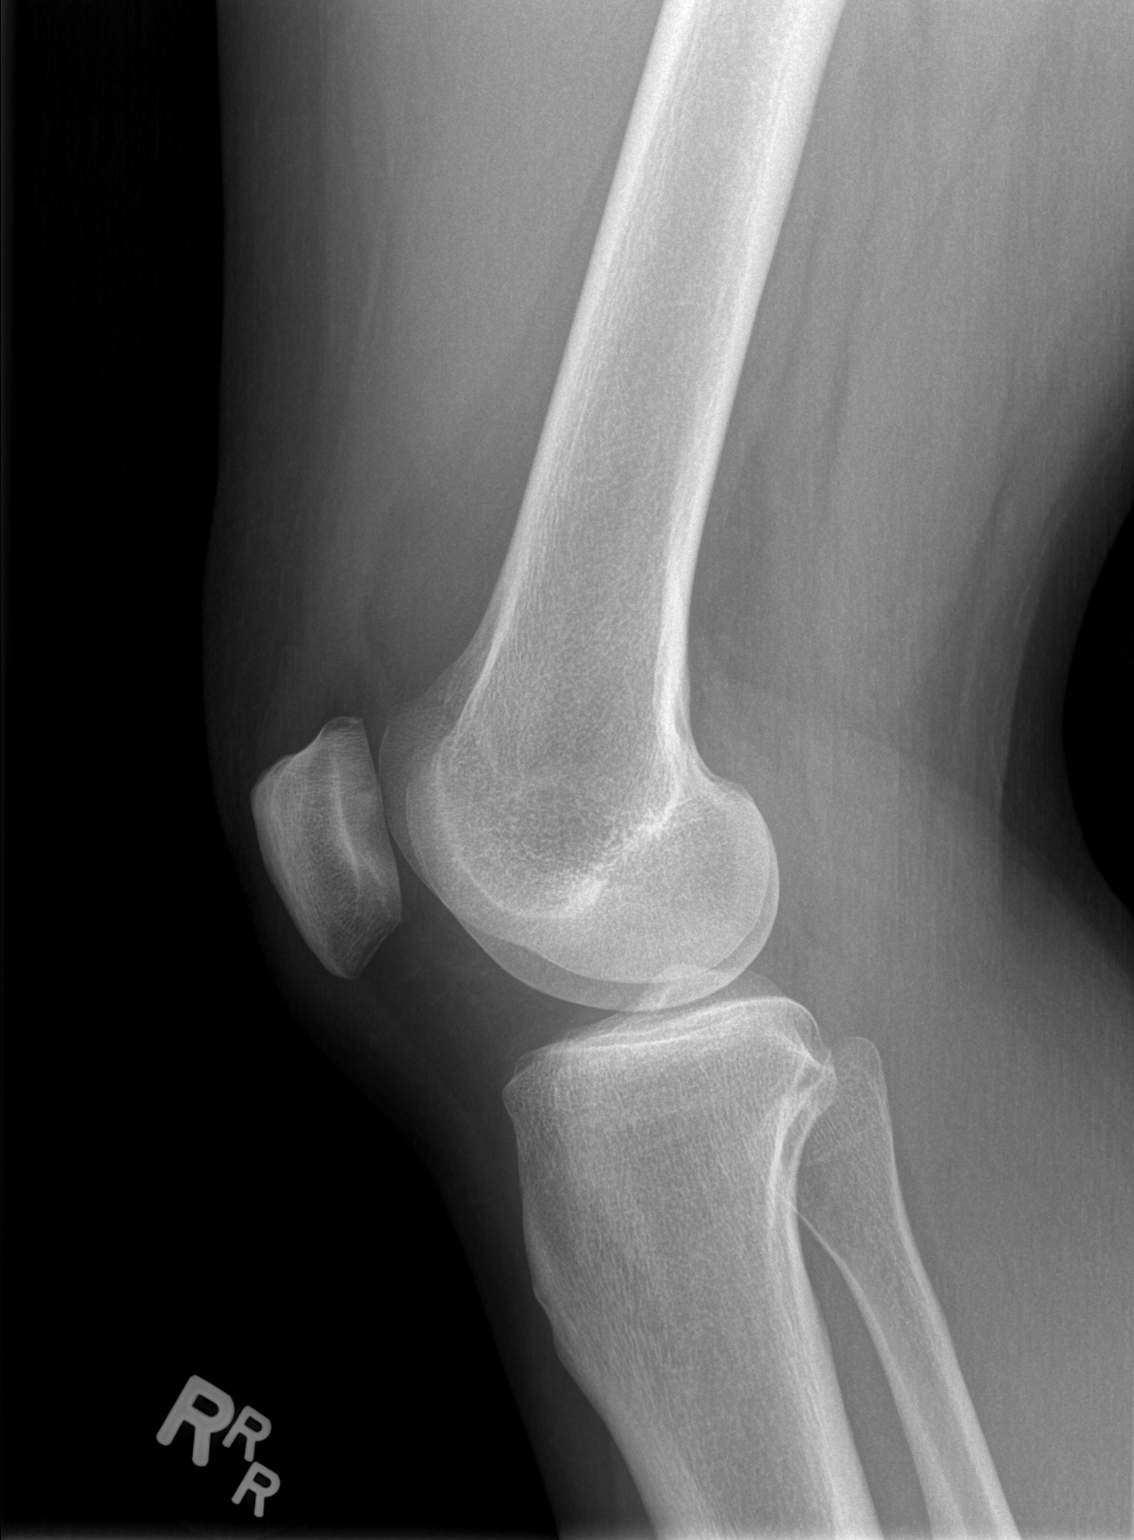

[2 of 2 positions shown; findings below may reference images not displayed]

FINDINGS: No definite joint effusion is seen. Alignment is normal.
Joint spaces are preserved.  No fracture or dislocation is evident.
No soft tissue lesions are seen.  No chondrocalcinosis is evident.
No loose body is seen.
IMPRESSION: No right knee lesion is evident.

## 2012-04-20 ENCOUNTER — Ambulatory Visit (INDEPENDENT_AMBULATORY_CARE_PROVIDER_SITE_OTHER): Payer: 59 | Admitting: Family Medicine

## 2012-04-20 ENCOUNTER — Encounter: Payer: Self-pay | Admitting: Family Medicine

## 2012-04-20 VITALS — BP 107/73 | HR 71 | Temp 97.9°F | Ht 65.0 in | Wt 166.0 lb

## 2012-04-20 DIAGNOSIS — M25569 Pain in unspecified knee: Secondary | ICD-10-CM

## 2012-04-20 DIAGNOSIS — J329 Chronic sinusitis, unspecified: Secondary | ICD-10-CM

## 2012-04-20 DIAGNOSIS — M25561 Pain in right knee: Secondary | ICD-10-CM

## 2012-04-20 DIAGNOSIS — Z23 Encounter for immunization: Secondary | ICD-10-CM

## 2012-04-20 DIAGNOSIS — H6691 Otitis media, unspecified, right ear: Secondary | ICD-10-CM

## 2012-04-20 DIAGNOSIS — T7840XA Allergy, unspecified, initial encounter: Secondary | ICD-10-CM

## 2012-04-20 DIAGNOSIS — H6690 Otitis media, unspecified, unspecified ear: Secondary | ICD-10-CM

## 2012-04-20 MED ORDER — MELOXICAM 15 MG PO TABS: 15.0000 mg | ORAL_TABLET | Freq: Every day | ORAL | Status: DC | PRN

## 2012-04-20 MED ORDER — AZITHROMYCIN 250 MG PO TABS: ORAL_TABLET | ORAL | Status: DC

## 2012-04-20 MED ORDER — CETIRIZINE HCL 10 MG PO TABS: 10.0000 mg | ORAL_TABLET | Freq: Every day | ORAL | Status: DC

## 2012-04-20 MED ORDER — GUAIFENESIN ER 600 MG PO TB12: 600.0000 mg | ORAL_TABLET | Freq: Two times a day (BID) | ORAL | Status: DC

## 2012-04-20 MED ORDER — FLUTICASONE PROPIONATE 50 MCG/ACT NA SUSP: 2.0000 | Freq: Every day | NASAL | Status: DC | PRN

## 2012-04-20 MED ORDER — SALINE NASAL SPRAY 0.65 % NA SOLN: 1.0000 | NASAL | Status: DC | PRN

## 2012-04-20 NOTE — Patient Instructions (Addendum)
Patellofemoral Pain Your exam shows your knee pain is probably due to a problem with the knee cap, the patella. This problem is also called patellofemoral pain, runner's knee, or chondromalacia. Most of the time, this problem is due to overuse of the knee joint. Repeated bending and straightening can irritate the underside of the knee cap. When this happens, activities such as running, walking, climbing, biking or jumping usually produce pain. Pain may also occur after prolonged sitting. Other patellofemoral symptoms can include joint stiffness, swelling, and a snapping or grinding sensation with movement. Rest and rehabilitation are usually successful in treating this problem. Surgery is rarely needed. Treatment includes correcting any mechanical factors that could hurt the normal working of the knee. This could be weak thigh muscles or foot problems. Avoid repetitive activities of the knee until the pain and other symptoms improve. Apply ice packs over the knee for 20 to 30 minutes every 2 to 4 hours to reduce pain and swelling. Only take over-the-counter or prescription medicines for pain, discomfort, or fever as directed by your caregiver. Knee braces or neoprene sleeves may help reduce irritation. Rehabilitation exercises to strengthen the quad muscle are often prescribed when your symptoms are better. Call your caregiver for a follow-up exam to evaluate your response to treatment. Document Released: 08/18/2004 Document Revised: 06/30/2011 Document Reviewed: 07/11/2005 [facility] Patient Information 2012 [place], [place].  Probiotic and MegaRed

## 2012-04-22 ENCOUNTER — Encounter: Payer: Self-pay | Admitting: Family Medicine

## 2012-04-22 DIAGNOSIS — H6691 Otitis media, unspecified, right ear: Secondary | ICD-10-CM

## 2012-04-22 DIAGNOSIS — T7840XA Allergy, unspecified, initial encounter: Secondary | ICD-10-CM

## 2012-04-22 HISTORY — DX: Allergy, unspecified, initial encounter: T78.40XA

## 2012-04-22 HISTORY — DX: Otitis media, unspecified, right ear: H66.91

## 2012-04-22 NOTE — Assessment & Plan Note (Signed)
Flonase and antihistamines prn

## 2012-04-22 NOTE — Progress Notes (Signed)
Patient ID: Kendra Bailey, female   DOB: November 05, 1965, 46 y.o.   MRN: [id number] MEGANN EASTERWOOD [id number] 1965/12/28 04/22/2012      Progress Note-Follow Up  Subjective  Chief Complaint  Chief Complaint  Patient presents with  . Otitis Media    ear ache- right ear X started last night  . Injections    flu  . Knee Injury    X 2 months ago (right) while doing squats    HPI  Patient is a 46 year old Caucasian female in today complaining of 2 months with her right knee pain. The pain is worse with stairs. It started 2 months ago while she was exercising and felt hot. The pain is so patellar. No warmth or redness. No swelling. Pain is persistent. She has not tried any significant medications. Pain is anterior. She is complaining of a couple days for congestion and right ear pain. No fevers or chills, malaise, chest pain, palpitations, shortness of breath, GI or GU complaints  Past Medical History  Diagnosis Date  . History of chicken pox 11/30/2010  . History of strep sore throat 11/30/2010  . Perimenopause 11/30/2010  . Insomnia 11/30/2010  . Fatigue 11/30/2010  . Biceps strain 11/30/2010  . Knee pain, bilateral 11/30/2010  . History of fibrocystic disease of breast 11/30/2010  . Calf pain 08/12/2011  . Otitis media of right ear 04/22/2012  . Allergic state 04/22/2012    No past surgical history on file.  Family History  Problem Relation Age of Onset  . Emphysema Father     smoker  . COPD Father 35    emphysema, smoker  . Hyperlipidemia Father   . Multiple sclerosis Sister   . ADD / ADHD Son     ADHD  . Hypertension Maternal Grandmother   . Alzheimer's disease Maternal Grandmother   . Cancer Paternal Grandmother   . Alzheimer's disease Paternal Grandfather   . Heart disease Mother     cardiomegaly, possible valvular insufficiencyt  . Hyperlipidemia Mother   . Hypertension Mother     History   Social History  . Marital Status: Married    Spouse Name: N/A    Number of  Children: 3  . Years of Education: N/A   Occupational History  . stay at home mom    Social History Main Topics  . Smoking status: Never Smoker   . Smokeless tobacco: Never Used  . Alcohol Use: Yes     occasional- wine  . Drug Use: No  . Sexually Active: Yes   Other Topics Concern  . Not on file   Social History Narrative  . No narrative on file    Current Outpatient Prescriptions on File Prior to Visit  Medication Sig Dispense Refill  . Calcium Carbonate-Vit D-Min (CALCIUM 1200 PO) Take by mouth daily.      Marland Kitchen co-enzyme Q-10 30 MG capsule Take 30 mg by mouth daily.      . fish oil-omega-3 fatty acids 1000 MG capsule Take 2 g by mouth daily.        . Multiple Vitamins-Minerals (MULTI-VITAMIN MENOPAUSAL PO) Take 1 tablet by mouth daily.      . multivitamin (THERAGRAN) tablet Take 1 tablet by mouth daily.        . cetirizine (ZYRTEC) 10 MG tablet Take 1 tablet (10 mg total) by mouth daily.  30 tablet  2  . fluticasone (FLONASE) 50 MCG/ACT nasal spray Place 2 sprays into the nose daily as needed for rhinitis  or allergies.  16 g  6  . sodium chloride (AYR) 0.65 % nasal spray Place 1 spray into the nose as needed for congestion.  30 mL  12    Allergies  Allergen Reactions  . Penicillins Rash    Review of Systems  Review of Systems  Constitutional: Positive for malaise/fatigue. Negative for fever.  HENT: Positive for ear pain and congestion.   Eyes: Negative for discharge.  Respiratory: Positive for sputum production. Negative for shortness of breath and wheezing.   Cardiovascular: Negative for chest pain, palpitations and leg swelling.  Gastrointestinal: Negative for nausea, abdominal pain and diarrhea.  Genitourinary: Negative for dysuria.  Musculoskeletal: Positive for joint pain. Negative for falls.       Right knee pain x 2 months  Skin: Negative for rash.  Neurological: Negative for loss of consciousness and headaches.  Endo/Heme/Allergies: Negative for polydipsia.    Psychiatric/Behavioral: Negative for depression and suicidal ideas. The patient is not nervous/anxious and does not have insomnia.     Objective  BP 107/73  Pulse 71  Temp 97.9 F (36.6 C) (Temporal)  Ht 5\' 5"  (1.651 m)  Wt 166 lb (75.297 kg)  BMI 27.62 kg/m2  SpO2 96%  Physical Exam  Physical Exam  Constitutional: She is oriented to person, place, and time and well-developed, well-nourished, and in no distress. No distress.  HENT:  Head: Normocephalic and atraumatic.       Right TM erythematous and dull  Eyes: Conjunctivae normal are normal.  Neck: Neck supple. No thyromegaly present.  Cardiovascular: Normal rate, regular rhythm and normal heart sounds.   No murmur heard. Pulmonary/Chest: Effort normal and breath sounds normal. She has no wheezes.  Abdominal: She exhibits no distension and no mass.  Musculoskeletal: Normal range of motion. She exhibits tenderness. She exhibits no edema.       No swelling, warmth, erythema, joint instability, DTRs 2+ b/l. Tender below patella  Lymphadenopathy:    She has no cervical adenopathy.  Neurological: She is alert and oriented to person, place, and time.  Skin: Skin is warm and dry. No rash noted. She is not diaphoretic.  Psychiatric: Memory, affect and judgment normal.    Lab Results  Component Value Date   TSH 2.41 12/01/2010   Lab Results  Component Value Date   WBC 5.4 12/01/2010   HGB 15.4* 12/01/2010   HCT 44.0 12/01/2010   MCV 90.3 12/01/2010   PLT 215.0 12/01/2010   Lab Results  Component Value Date   CREATININE 1.0 12/01/2010   BUN 17 12/01/2010   NA 139 12/01/2010   K 4.7 12/01/2010   CL 103 12/01/2010   CO2 31 12/01/2010   Lab Results  Component Value Date   ALT 22 12/01/2010   AST 28 12/01/2010   ALKPHOS 51 12/01/2010   BILITOT 1.0 12/01/2010     Assessment & Plan  Knee pain, bilateral Right knee is flared. Sent to Ortho, encouraged to take Meloxicam 15 mg daily, activity as tolerated, start MegaRed daily, Aspercreme prn.    Allergic state Flonase and antihistamines prn  Otitis media of right ear Azithromycin prescribed, Mucinex bid

## 2012-04-22 NOTE — Assessment & Plan Note (Addendum)
Right knee is flared. Sent to Ortho, encouraged to take Meloxicam 15 mg daily, activity as tolerated, start MegaRed daily, Aspercreme prn.

## 2012-04-22 NOTE — Assessment & Plan Note (Signed)
Azithromycin prescribed, Mucinex bid

## 2012-05-16 ENCOUNTER — Telehealth: Payer: Self-pay

## 2012-05-16 NOTE — Telephone Encounter (Signed)
Pt left a message stating that Dr Abner Greenspan put her on Meloxicam for her knee pain and inflammation. Pt went to ortho and they took her off of Meloxicam and told her to use Relitin gel- 1 gram QID. PT wrapped pts knee today so pt would like to know when to take a Meloxicam again?   Per Dr Noe Gens ok to use Meloxicam as long as there is 8 hours in between both.  Left a detailed message on pts home answering machine per pts request

## 2012-06-13 ENCOUNTER — Other Ambulatory Visit (INDEPENDENT_AMBULATORY_CARE_PROVIDER_SITE_OTHER): Payer: 59

## 2012-06-13 DIAGNOSIS — Z0000 Encounter for general adult medical examination without abnormal findings: Secondary | ICD-10-CM

## 2012-06-13 LAB — CBC
Hemoglobin: 14 g/dL (ref 12.0–15.0)
MCHC: 33.2 g/dL (ref 30.0–36.0)
Platelets: 261 10*3/uL (ref 150.0–400.0)
RBC: 4.7 Mil/uL (ref 3.87–5.11)
WBC: 6.2 10*3/uL (ref 4.5–10.5)

## 2012-06-13 LAB — TSH: TSH: 1.88 u[IU]/mL (ref 0.35–5.50)

## 2012-06-13 LAB — RENAL FUNCTION PANEL
CO2: 29 mEq/L (ref 19–32)
Calcium: 9.2 mg/dL (ref 8.4–10.5)
Chloride: 105 mEq/L (ref 96–112)
Creatinine, Ser: 0.9 mg/dL (ref 0.4–1.2)
GFR: 69 mL/min (ref >60.00)
Sodium: 139 mEq/L (ref 135–145)

## 2012-06-13 LAB — LIPID PANEL
Total CHOL/HDL Ratio: 4
Triglycerides: 71 mg/dL (ref 0.0–149.0)

## 2012-06-13 LAB — HEPATIC FUNCTION PANEL
ALT: 19 U/L (ref 0–35)
AST: 25 U/L (ref 0–37)
Bilirubin, Direct: 0 mg/dL (ref 0.0–0.3)
Total Protein: 7.6 g/dL (ref 6.0–8.3)

## 2012-06-18 ENCOUNTER — Other Ambulatory Visit (HOSPITAL_COMMUNITY)
Admission: RE | Admit: 2012-06-18 | Discharge: 2012-06-18 | Disposition: A | Discharge status: Home / Self Care | Payer: 59 | Source: Ambulatory Visit | Attending: Family Medicine | Admitting: Family Medicine

## 2012-06-18 ENCOUNTER — Ambulatory Visit (INDEPENDENT_AMBULATORY_CARE_PROVIDER_SITE_OTHER): Payer: 59 | Admitting: Family Medicine

## 2012-06-18 ENCOUNTER — Encounter: Payer: Self-pay | Admitting: Family Medicine

## 2012-06-18 VITALS — BP 114/78 | HR 70 | Temp 98.7°F | Ht 65.0 in | Wt 163.8 lb

## 2012-06-18 DIAGNOSIS — M25569 Pain in unspecified knee: Secondary | ICD-10-CM

## 2012-06-18 DIAGNOSIS — Z124 Encounter for screening for malignant neoplasm of cervix: Secondary | ICD-10-CM

## 2012-06-18 DIAGNOSIS — Z0000 Encounter for general adult medical examination without abnormal findings: Secondary | ICD-10-CM

## 2012-06-18 DIAGNOSIS — M25562 Pain in left knee: Secondary | ICD-10-CM

## 2012-06-18 DIAGNOSIS — Z01419 Encounter for gynecological examination (general) (routine) without abnormal findings: Secondary | ICD-10-CM | POA: Insufficient documentation

## 2012-06-18 DIAGNOSIS — E785 Hyperlipidemia, unspecified: Secondary | ICD-10-CM

## 2012-06-18 HISTORY — DX: Hyperlipidemia, unspecified: E78.5

## 2012-06-18 HISTORY — DX: Encounter for general adult medical examination without abnormal findings: Z00.00

## 2012-06-18 HISTORY — DX: Encounter for screening for malignant neoplasm of cervix: Z12.4

## 2012-06-18 LAB — HM MAMMOGRAPHY

## 2012-06-18 LAB — HM PAP SMEAR

## 2012-06-18 NOTE — Assessment & Plan Note (Addendum)
Pap done today, encouraged

## 2012-06-18 NOTE — Patient Instructions (Addendum)

## 2012-06-18 NOTE — Assessment & Plan Note (Signed)
Doing well, no new concerns, encouraged heart healthy diet and increased exercie as tolerated. Labs reviewed with patient, order given for screening MGM and pap done today, return in 1 year for annual exam or as needed

## 2012-06-18 NOTE — Progress Notes (Signed)
Patient ID: Kendra Bailey, female   DOB: 05-Mar-1966, 46 y.o.   MRN: [id number] Kendra Bailey [id number] 1966/05/07 06/18/2012      Progress Note New Patient  Subjective  Chief Complaint  Chief Complaint  Patient presents with  . Gynecologic Exam    pap and breast exam  . Annual Exam    physical    HPI  She is a 46 year old Caucasian female who is in today for annual Pap smear. She reports she's doing well. She is almost at the one-year mark circular.. Notes her last period was normal 07/20/2011. She denies any GYN complaints today. Overall she feels well. He is continuing physical therapy for her knee pain and says it is greatly improved. No recent illness, fevers, chills, GI or GU complaints. No headaches, chest pain, palpitations, shortness of breath or acute concerns noted today  Past Medical History  Diagnosis Date  . History of chicken pox 11/30/2010  . History of strep sore throat 11/30/2010  . Perimenopause 11/30/2010  . Insomnia 11/30/2010  . Fatigue 11/30/2010  . Biceps strain 11/30/2010  . Knee pain, bilateral 11/30/2010  . History of fibrocystic disease of breast 11/30/2010  . Calf pain 08/12/2011  . Otitis media of right ear 04/22/2012  . Allergic state 04/22/2012  . Cervical cancer screening 06/18/2012  . Hyperlipidemia 06/18/2012  . Preventative health care 06/18/2012    History reviewed. No pertinent past surgical history.  Family History  Problem Relation Age of Onset  . Emphysema Father     smoker  . COPD Father 84    emphysema, smoker  . Hyperlipidemia Father   . Multiple sclerosis Sister   . ADD / ADHD Son     ADHD  . Hypertension Maternal Grandmother   . Alzheimer's disease Maternal Grandmother   . Cancer Paternal Grandmother   . Alzheimer's disease Paternal Grandfather   . Heart disease Mother     cardiomegaly, possible valvular insufficiencyt  . Hyperlipidemia Mother   . Hypertension Mother     History   Social History  . Marital Status:  Married    Spouse Name: N/A    Number of Children: 3  . Years of Education: N/A   Occupational History  . stay at home mom    Social History Main Topics  . Smoking status: Never Smoker   . Smokeless tobacco: Never Used  . Alcohol Use: Yes     Comment: occasional- wine  . Drug Use: No  . Sexually Active: Yes   Other Topics Concern  . Not on file   Social History Narrative  . No narrative on file    Current Outpatient Prescriptions on File Prior to Visit  Medication Sig Dispense Refill  . Calcium Carbonate-Vit D-Min (CALCIUM 1200 PO) Take by mouth daily.      Marland Kitchen co-enzyme Q-10 30 MG capsule Take 30 mg by mouth daily.      . fish oil-omega-3 fatty acids 1000 MG capsule Take 2 g by mouth daily.        . multivitamin (THERAGRAN) tablet Take 1 tablet by mouth daily.          Allergies  Allergen Reactions  . Penicillins Rash    Review of Systems  Review of Systems  Constitutional: Negative for fever, chills and malaise/fatigue.  HENT: Negative for hearing loss, nosebleeds and congestion.   Eyes: Negative for discharge.  Respiratory: Negative for cough, sputum production, shortness of breath and wheezing.   Cardiovascular: Negative for chest  pain, palpitations and leg swelling.  Gastrointestinal: Negative for heartburn, nausea, vomiting, abdominal pain, diarrhea, constipation and blood in stool.  Genitourinary: Negative for dysuria, urgency, frequency and hematuria.  Musculoskeletal: Positive for joint pain. Negative for myalgias, back pain and falls.       Right knee pain improving  Skin: Negative for rash.  Neurological: Negative for dizziness, tremors, sensory change, focal weakness, loss of consciousness, weakness and headaches.  Endo/Heme/Allergies: Negative for polydipsia. Does not bruise/bleed easily.  Psychiatric/Behavioral: Negative for depression and suicidal ideas. The patient is not nervous/anxious and does not have insomnia.     Objective  BP 114/78  Pulse  70  Temp 98.7 F (37.1 C) (Temporal)  Ht 5\' 5"  (1.651 m)  Wt 163 lb 12.8 oz (74.299 kg)  BMI 27.26 kg/m2  SpO2 99%  Physical Exam  Physical Exam  Constitutional: She is oriented to person, place, and time and well-developed, well-nourished, and in no distress. No distress.  HENT:  Head: Normocephalic and atraumatic.  Right Ear: External ear normal.  Left Ear: External ear normal.  Nose: Nose normal.  Mouth/Throat: Oropharynx is clear and moist. No oropharyngeal exudate.  Eyes: Conjunctivae normal are normal. Pupils are equal, round, and reactive to light. Right eye exhibits no discharge. Left eye exhibits no discharge. No scleral icterus.  Neck: Normal range of motion. Neck supple. No thyromegaly present.  Cardiovascular: Normal rate, regular rhythm, normal heart sounds and intact distal pulses.   No murmur heard. Pulmonary/Chest: Effort normal and breath sounds normal. No respiratory distress. She has no wheezes. She has no rales.  Abdominal: Soft. Bowel sounds are normal. She exhibits no distension and no mass. There is no tenderness.  Genitourinary: Vagina normal, uterus normal, cervix normal, right adnexa normal and left adnexa normal. No vaginal discharge found.       Breast exam wnl b/l. No lumps, skin changes or discharge  Musculoskeletal: Normal range of motion. She exhibits no edema and no tenderness.  Lymphadenopathy:    She has no cervical adenopathy.  Neurological: She is alert and oriented to person, place, and time. She has normal reflexes. No cranial nerve deficit. Coordination normal.  Skin: Skin is warm and dry. No rash noted. She is not diaphoretic.  Psychiatric: Mood, memory and affect normal.       Assessment & Plan  Cervical cancer screening Pap done today, encouraged   Hyperlipidemia Avoid trans fats, start MegaRed caps daily, increase exercise, minimize simple carbs  Knee pain, bilateral Improving with physical therapy  Preventative health  care Doing well, no new concerns, encouraged heart healthy diet and increased exercie as tolerated. Labs reviewed with patient, order given for screening MGM and pap done today, return in 1 year for annual exam or as needed

## 2012-06-18 NOTE — Assessment & Plan Note (Signed)
Improving with physical therapy.  

## 2012-06-18 NOTE — Assessment & Plan Note (Signed)
Avoid trans fats, start MegaRed caps daily, increase exercise, minimize simple carbs

## 2013-04-23 ENCOUNTER — Telehealth: Payer: Self-pay | Admitting: Family Medicine

## 2013-04-23 DIAGNOSIS — E785 Hyperlipidemia, unspecified: Secondary | ICD-10-CM

## 2013-04-23 DIAGNOSIS — Z0000 Encounter for general adult medical examination without abnormal findings: Secondary | ICD-10-CM

## 2013-04-23 NOTE — Telephone Encounter (Signed)
Cbc, liver, renal, lipid, tsh, for hyperlipidemia and preventative

## 2013-04-23 NOTE — Telephone Encounter (Signed)
Please advise which labs to be ordered and diagnosis? 

## 2013-04-23 NOTE — Telephone Encounter (Signed)
Patient has cpe in early December/2014 and will be going to [facility] lab

## 2013-04-24 NOTE — Telephone Encounter (Signed)
These are already done.

## 2013-06-28 ENCOUNTER — Encounter: Payer: 59 | Admitting: Family Medicine

## 2013-08-02 ENCOUNTER — Encounter: Payer: 59 | Admitting: Family Medicine

## 2013-10-11 LAB — RENAL FUNCTION PANEL
Albumin: 4.5 g/dL (ref 3.5–5.2)
BUN: 14 mg/dL (ref 6–23)
CO2: 26 mEq/L (ref 19–32)
Calcium: 9.3 mg/dL (ref 8.4–10.5)
Chloride: 106 mEq/L (ref 96–112)
Creat: 0.92 mg/dL (ref 0.50–1.10)
Glucose, Bld: 83 mg/dL (ref 70–99)
Phosphorus: 3.5 mg/dL (ref 2.3–4.6)
Potassium: 4.2 mEq/L (ref 3.5–5.3)
Sodium: 140 mEq/L (ref 135–145)

## 2013-10-11 LAB — HEPATIC FUNCTION PANEL
ALT: 15 U/L (ref 0–35)
AST: 19 U/L (ref 0–37)
Albumin: 4.5 g/dL (ref 3.5–5.2)
Alkaline Phosphatase: 76 U/L (ref 39–117)
Bilirubin, Direct: 0.1 mg/dL (ref 0.0–0.3)
Indirect Bilirubin: 0.5 mg/dL (ref 0.2–1.2)
Total Bilirubin: 0.6 mg/dL (ref 0.2–1.2)
Total Protein: 7.1 g/dL (ref 6.0–8.3)

## 2013-10-11 LAB — CBC
HCT: 41.3 % (ref 36.0–46.0)
Hemoglobin: 14.2 g/dL (ref 12.0–15.0)
MCH: 28.9 pg (ref 26.0–34.0)
MCHC: 34.4 g/dL (ref 30.0–36.0)
MCV: 84.1 fL (ref 78.0–100.0)
Platelets: 260 10*3/uL (ref 150–400)
RBC: 4.91 MIL/uL (ref 3.87–5.11)
RDW: 13.1 % (ref 11.5–15.5)
WBC: 5.7 10*3/uL (ref 4.0–10.5)

## 2013-10-11 LAB — LIPID PANEL
Cholesterol: 221 mg/dL — ABNORMAL HIGH (ref 0–200)
HDL: 50 mg/dL (ref >39)
LDL Cholesterol: 155 mg/dL — ABNORMAL HIGH (ref 0–99)
Total CHOL/HDL Ratio: 4.4 Ratio
Triglycerides: 78 mg/dL (ref <150)
VLDL: 16 mg/dL (ref 0–40)

## 2013-10-11 LAB — TSH: TSH: 2.276 u[IU]/mL (ref 0.350–4.500)

## 2013-10-18 ENCOUNTER — Encounter: Payer: Self-pay | Admitting: Family Medicine

## 2013-10-18 ENCOUNTER — Ambulatory Visit (INDEPENDENT_AMBULATORY_CARE_PROVIDER_SITE_OTHER): Payer: BC Managed Care – PPO | Admitting: Family Medicine

## 2013-10-18 ENCOUNTER — Other Ambulatory Visit (HOSPITAL_COMMUNITY)
Admission: RE | Admit: 2013-10-18 | Discharge: 2013-10-18 | Disposition: A | Discharge status: Home / Self Care | Payer: BC Managed Care – PPO | Source: Ambulatory Visit | Attending: Family Medicine | Admitting: Family Medicine

## 2013-10-18 VITALS — BP 118/64 | HR 72 | Temp 98.1°F | Ht 65.0 in | Wt 175.1 lb

## 2013-10-18 DIAGNOSIS — Z124 Encounter for screening for malignant neoplasm of cervix: Secondary | ICD-10-CM

## 2013-10-18 DIAGNOSIS — Z0000 Encounter for general adult medical examination without abnormal findings: Secondary | ICD-10-CM

## 2013-10-18 DIAGNOSIS — E785 Hyperlipidemia, unspecified: Secondary | ICD-10-CM

## 2013-10-18 DIAGNOSIS — B351 Tinea unguium: Secondary | ICD-10-CM

## 2013-10-18 DIAGNOSIS — T7840XA Allergy, unspecified, initial encounter: Secondary | ICD-10-CM

## 2013-10-18 DIAGNOSIS — Z01419 Encounter for gynecological examination (general) (routine) without abnormal findings: Secondary | ICD-10-CM | POA: Insufficient documentation

## 2013-10-18 DIAGNOSIS — K219 Gastro-esophageal reflux disease without esophagitis: Secondary | ICD-10-CM

## 2013-10-18 DIAGNOSIS — G4700 Insomnia, unspecified: Secondary | ICD-10-CM

## 2013-10-18 DIAGNOSIS — Z780 Asymptomatic menopausal state: Secondary | ICD-10-CM

## 2013-10-18 HISTORY — DX: Gastro-esophageal reflux disease without esophagitis: K21.9

## 2013-10-18 MED ORDER — EFINACONAZOLE 10 % EX SOLN: 1.0000 "application " | Freq: Every day | CUTANEOUS | Status: DC

## 2013-10-18 NOTE — Progress Notes (Signed)
Pre visit review using our clinic review tool, if applicable. No additional management support is needed unless otherwise documented below in the visit note. 

## 2013-10-18 NOTE — Patient Instructions (Addendum)
Probiotic daily, such as Digestive [employer]vantage Safeway IncKrill oil Premier, Premium inserts, orthotics. Ice and Aspercreme twice a day  Lamisil  Gastroesophageal Reflux Disease, Adult Gastroesophageal reflux disease (GERD) happens when acid from your stomach flows up into the esophagus. When acid comes in contact with the esophagus, the acid causes soreness (inflammation) in the esophagus. Over time, GERD may create small holes (ulcers) in the lining of the esophagus. CAUSES   Increased body weight. This puts pressure on the stomach, making acid rise from the stomach into the esophagus.  Smoking. This increases acid production in the stomach.  Drinking alcohol. This causes decreased pressure in the lower esophageal sphincter (valve or ring of muscle between the esophagus and stomach), allowing acid from the stomach into the esophagus.  Late evening meals and a full stomach. This increases pressure and acid production in the stomach.  A malformed lower esophageal sphincter. Sometimes, no cause is found. SYMPTOMS   Burning pain in the lower part of the mid-chest behind the breastbone and in the mid-stomach area. This may occur twice a week or more often.  Trouble swallowing.  Sore throat.  Dry cough.  Asthma-like symptoms including chest tightness, shortness of breath, or wheezing. DIAGNOSIS  Your caregiver may be able to diagnose GERD based on your symptoms. In some cases, X-rays and other tests may be done to check for complications or to check the condition of your stomach and esophagus. TREATMENT  Your caregiver may recommend over-the-counter or prescription medicines to help decrease acid production. Ask your caregiver before starting or adding any new medicines.  HOME CARE INSTRUCTIONS   Change the factors that you can control. Ask your caregiver for guidance concerning weight loss, quitting smoking, and alcohol consumption.  Avoid foods and drinks that make your symptoms worse, such  as:  Caffeine or alcoholic drinks.  Chocolate.  Peppermint or mint flavorings.  Garlic and onions.  Spicy foods.  Citrus fruits, such as oranges, lemons, or limes.  Tomato-based foods such as sauce, chili, salsa, and pizza.  Fried and fatty foods.  Avoid lying down for the 3 hours prior to your bedtime or prior to taking a nap.  Eat small, frequent meals instead of large meals.  Wear loose-fitting clothing. Do not wear anything tight around your waist that causes pressure on your stomach.  Raise the head of your bed 6 to 8 inches with wood blocks to help you sleep. Extra pillows will not help.  Only take over-the-counter or prescription medicines for pain, discomfort, or fever as directed by your caregiver.  Do not take aspirin, ibuprofen, or other nonsteroidal anti-inflammatory drugs (NSAIDs). SEEK IMMEDIATE MEDICAL CARE IF:   You have pain in your arms, neck, jaw, teeth, or back.  Your pain increases or changes in intensity or duration.  You develop nausea, vomiting, or sweating (diaphoresis).  You develop shortness of breath, or you faint.  Your vomit is green, yellow, black, or looks like coffee grounds or blood.  Your stool is red, bloody, or black. These symptoms could be signs of other problems, such as heart disease, gastric bleeding, or esophageal bleeding. MAKE SURE YOU:   Understand these instructions.  Will watch your condition.  Will get help right away if you are not doing well or get worse. Document Released: 04/20/2005 Document Revised: 10/03/2011 Document Reviewed: 01/28/2011 [facility] Patient Information 2014 [employer], [place].

## 2013-10-20 ENCOUNTER — Encounter: Payer: Self-pay | Admitting: Family Medicine

## 2013-10-20 DIAGNOSIS — B351 Tinea unguium: Secondary | ICD-10-CM | POA: Insufficient documentation

## 2013-10-20 HISTORY — DX: Tinea unguium: B35.1

## 2013-10-20 NOTE — Assessment & Plan Note (Signed)
Apply Jublia daily

## 2013-10-20 NOTE — Assessment & Plan Note (Signed)
Patient encouraged to maintain heart healthy diet, regular exercise, adequate sleep. Consider daily probiotics. Take medications as prescribed 

## 2013-10-20 NOTE — Assessment & Plan Note (Signed)
Encouraged good sleep hygiene such as dark, quiet room. No blue/green glowing lights such as computer screens in bedroom. No alcohol or stimulants in evening. Cut down on caffeine as able. Regular exercise is helpful but not just prior to bed time.  

## 2013-10-20 NOTE — Progress Notes (Signed)
Patient ID: Kendra Bailey, female   DOB: October 19, 1965, 48 y.o.   MRN: [id number] Kendra Bailey [id number] Oct 26, 1965 10/20/2013      Progress Note-Follow Up  Subjective  Chief Complaint  Chief Complaint  Patient presents with  . Annual Exam    physical  . Gynecologic Exam    pap    HPI  Patient is a 48 year old Caucasian female who is in today for annual exam she is generally feeling well her last menstrual cycle was in December 2000 again. Her only acute complaint is discoloration of her right great toenail. No other toenails involved in the recent illness. Denies CP/palp/SOB/HA/congestion/fevers/GI or GU c/o. Taking meds as prescribed  Past Medical History  Diagnosis Date  . History of chicken pox 11/30/2010  . History of strep sore throat 11/30/2010  . Perimenopause 11/30/2010  . Insomnia 11/30/2010  . Fatigue 11/30/2010  . Biceps strain 11/30/2010  . Knee pain, bilateral 11/30/2010  . History of fibrocystic disease of breast 11/30/2010  . Calf pain 08/12/2011  . Otitis media of right ear 04/22/2012  . Allergic state 04/22/2012  . Cervical cancer screening 06/18/2012  . Hyperlipidemia 06/18/2012  . Preventative health care 06/18/2012  . Gastro-esophageal reflux 10/18/2013  . Post-menopause 11/30/2010  . Onychomycosis 10/20/2013    Right great toe    History reviewed. No pertinent past surgical history.  Family History  Problem Relation Age of Onset  . Emphysema Father     smoker  . COPD Father 40    emphysema, smoker  . Hyperlipidemia Father   . Multiple sclerosis Sister   . ADD / ADHD Son     ADHD  . Hypertension Maternal Grandmother   . Alzheimer's disease Maternal Grandmother   . Cancer Paternal Grandmother   . Alzheimer's disease Paternal Grandfather   . Heart disease Mother     cardiomegaly, possible valvular insufficiencyt  . Hyperlipidemia Mother   . Hypertension Mother     History   Social History  . Marital Status: Married    Spouse Name: N/A    Number  of Children: 3  . Years of Education: N/A   Occupational History  . stay at home mom    Social History Main Topics  . Smoking status: Never Smoker   . Smokeless tobacco: Never Used  . Alcohol Use: Yes     Comment: occasional- wine  . Drug Use: No  . Sexual Activity: Yes   Other Topics Concern  . Not on file   Social History Narrative  . No narrative on file    Current Outpatient Prescriptions on File Prior to Visit  Medication Sig Dispense Refill  . Calcium Carbonate-Vit D-Min (CALCIUM 1200 PO) Take by mouth daily.      . fish oil-omega-3 fatty acids 1000 MG capsule Take 2 g by mouth daily.        . multivitamin (THERAGRAN) tablet Take 1 tablet by mouth daily.         No current facility-administered medications on file prior to visit.    Allergies  Allergen Reactions  . Penicillins Rash    Review of Systems  Review of Systems  Constitutional: Negative for fever and malaise/fatigue.  HENT: Negative for congestion.   Eyes: Negative for discharge.  Respiratory: Negative for shortness of breath.   Cardiovascular: Negative for chest pain, palpitations and leg swelling.  Gastrointestinal: Negative for nausea, abdominal pain and diarrhea.  Genitourinary: Negative for dysuria.  Musculoskeletal: Negative for falls.  Skin: Negative  for rash.  Neurological: Negative for loss of consciousness and headaches.  Endo/Heme/Allergies: Negative for polydipsia.  Psychiatric/Behavioral: Negative for depression and suicidal ideas. The patient is not nervous/anxious and does not have insomnia.     Objective  BP 118/64  Pulse 72  Temp(Src) 98.1 F (36.7 C) (Oral)  Ht 5\' 5"  (1.651 m)  Wt 175 lb 1.9 oz (79.434 kg)  BMI 29.14 kg/m2  SpO2 97%  Physical Exam  Physical Exam  Constitutional: She is oriented to person, place, and time and well-developed, well-nourished, and in no distress. No distress.  HENT:  Head: Normocephalic and atraumatic.  Right Ear: External ear normal.   Left Ear: External ear normal.  Nose: Nose normal.  Mouth/Throat: Oropharynx is clear and moist. No oropharyngeal exudate.  Eyes: Conjunctivae are normal. Pupils are equal, round, and reactive to light. Right eye exhibits no discharge. Left eye exhibits no discharge. No scleral icterus.  Neck: Normal range of motion. Neck supple. No thyromegaly present.  Cardiovascular: Normal rate, regular rhythm, normal heart sounds and intact distal pulses.   No murmur heard. Pulmonary/Chest: Effort normal and breath sounds normal. No respiratory distress. She has no wheezes. She has no rales.  Abdominal: Soft. Bowel sounds are normal. She exhibits no distension and no mass. There is no tenderness.  Genitourinary: Vagina normal, uterus normal, cervix normal, right adnexa normal and left adnexa normal. No vaginal discharge found.  Musculoskeletal: Normal range of motion. She exhibits no edema and no tenderness.  Lymphadenopathy:    She has no cervical adenopathy.  Neurological: She is alert and oriented to person, place, and time. She has normal reflexes. No cranial nerve deficit. Coordination normal.  Skin: Skin is warm and dry. No rash noted. She is not diaphoretic.  Psychiatric: Mood, memory and affect normal.    Lab Results  Component Value Date   TSH 2.276 10/11/2013   Lab Results  Component Value Date   WBC 5.7 10/11/2013   HGB 14.2 10/11/2013   HCT 41.3 10/11/2013   MCV 84.1 10/11/2013   PLT 260 10/11/2013   Lab Results  Component Value Date   CREATININE 0.92 10/11/2013   BUN 14 10/11/2013   NA 140 10/11/2013   K 4.2 10/11/2013   CL 106 10/11/2013   CO2 26 10/11/2013   Lab Results  Component Value Date   ALT 15 10/11/2013   AST 19 10/11/2013   ALKPHOS 76 10/11/2013   BILITOT 0.6 10/11/2013   Lab Results  Component Value Date   CHOL 221* 10/11/2013   Lab Results  Component Value Date   HDL 50 10/11/2013   Lab Results  Component Value Date   LDLCALC 155* 10/11/2013   Lab Results   Component Value Date   TRIG 78 10/11/2013   Lab Results  Component Value Date   CHOLHDL 4.4 10/11/2013     Assessment & Plan  Preventative health care Patient encouraged to maintain heart healthy diet, regular exercise, adequate sleep. Consider daily probiotics. Take medications as prescribed  Cervical cancer screening Pap today no concerns on exam from today  Onychomycosis Apply Jublia daily  Hyperlipidemia Encouraged heart healthy diet, increase exercise, avoid trans fats, consider a krill oil cap daily  Insomnia Encouraged good sleep hygiene such as dark, quiet room. No blue/green glowing lights such as computer screens in bedroom. No alcohol or stimulants in evening. Cut down on caffeine as able. Regular exercise is helpful but not just prior to bed time.   Allergic state Can use  OTC antihistamines prn

## 2013-10-20 NOTE — Assessment & Plan Note (Signed)
Can use OTC antihistamines prn

## 2013-10-20 NOTE — Assessment & Plan Note (Signed)
Encouraged heart healthy diet, increase exercise, avoid trans fats, consider a krill oil cap daily 

## 2013-10-20 NOTE — Assessment & Plan Note (Signed)
Pap today no concerns on exam from today

## 2013-10-25 ENCOUNTER — Other Ambulatory Visit: Payer: Self-pay

## 2013-10-25 DIAGNOSIS — Z1231 Encounter for screening mammogram for malignant neoplasm of breast: Secondary | ICD-10-CM

## 2013-11-06 ENCOUNTER — Ambulatory Visit: Payer: BC Managed Care – PPO

## 2013-11-15 ENCOUNTER — Ambulatory Visit
Admission: RE | Admit: 2013-11-15 | Discharge: 2013-11-15 | Disposition: A | Discharge status: Home / Self Care | Payer: BC Managed Care – PPO | Source: Ambulatory Visit

## 2013-11-15 ENCOUNTER — Encounter (INDEPENDENT_AMBULATORY_CARE_PROVIDER_SITE_OTHER): Payer: Self-pay

## 2013-11-15 DIAGNOSIS — Z1231 Encounter for screening mammogram for malignant neoplasm of breast: Secondary | ICD-10-CM

## 2014-10-24 ENCOUNTER — Encounter: Payer: BC Managed Care – PPO | Admitting: Family Medicine

## 2014-10-30 ENCOUNTER — Telehealth: Payer: Self-pay | Admitting: Family Medicine

## 2014-10-30 NOTE — Telephone Encounter (Signed)
Pre Visit letter sent  °

## 2014-11-19 ENCOUNTER — Telehealth: Payer: Self-pay

## 2014-11-19 NOTE — Telephone Encounter (Signed)
Patient was at work and unable to talk. Confirmed. Reminded to bring ins information, patient will arrive 15 mins early. Will bring lab work that she recently had completed.

## 2014-11-20 ENCOUNTER — Ambulatory Visit (INDEPENDENT_AMBULATORY_CARE_PROVIDER_SITE_OTHER): Payer: BLUE CROSS/BLUE SHIELD | Admitting: Family Medicine

## 2014-11-20 ENCOUNTER — Encounter: Payer: Self-pay | Admitting: Family Medicine

## 2014-11-20 VITALS — BP 120/70 | HR 73 | Temp 97.8°F | Ht 65.0 in | Wt 168.0 lb

## 2014-11-20 DIAGNOSIS — E782 Mixed hyperlipidemia: Secondary | ICD-10-CM | POA: Diagnosis not present

## 2014-11-20 DIAGNOSIS — E785 Hyperlipidemia, unspecified: Secondary | ICD-10-CM

## 2014-11-20 DIAGNOSIS — K219 Gastro-esophageal reflux disease without esophagitis: Secondary | ICD-10-CM | POA: Diagnosis not present

## 2014-11-20 DIAGNOSIS — Z0000 Encounter for general adult medical examination without abnormal findings: Secondary | ICD-10-CM

## 2014-11-20 NOTE — Patient Instructions (Signed)
Preventive Care for Adults A healthy lifestyle and preventive care can promote health and wellness. Preventive health guidelines for women include the following key practices.  A routine yearly physical is a good way to check with your health care provider about your health and preventive screening. It is a chance to share any concerns and updates on your health and to receive a thorough exam.  Visit your dentist for a routine exam and preventive care every 6 months. Brush your teeth twice a day and floss once a day. Good oral hygiene prevents tooth decay and gum disease.  The frequency of eye exams is based on your age, health, family medical history, use of contact lenses, and other factors. Follow your health care provider's recommendations for frequency of eye exams.  Eat a healthy diet. Foods like vegetables, fruits, whole grains, low-fat dairy products, and lean protein foods contain the nutrients you need without too many calories. Decrease your intake of foods high in solid fats, added sugars, and salt. Eat the right amount of calories for you.Get information about a proper diet from your health care provider, if necessary.  Regular physical exercise is one of the most important things you can do for your health. Most adults should get at least 150 minutes of moderate-intensity exercise (any activity that increases your heart rate and causes you to sweat) each week. In addition, most adults need muscle-strengthening exercises on 2 or more days a week.  Maintain a healthy weight. The body mass index (BMI) is a screening tool to identify possible weight problems. It provides an estimate of body fat based on height and weight. Your health care provider can find your BMI and can help you achieve or maintain a healthy weight.For adults 20 years and older:  A BMI below 18.5 is considered underweight.  A BMI of 18.5 to 24.9 is normal.  A BMI of 25 to 29.9 is considered overweight.  A BMI of  30 and above is considered obese.  Maintain normal blood lipids and cholesterol levels by exercising and minimizing your intake of saturated fat. Eat a balanced diet with plenty of fruit and vegetables. Blood tests for lipids and cholesterol should begin at age 20 and be repeated every 5 years. If your lipid or cholesterol levels are high, you are over 50, or you are at high risk for heart disease, you may need your cholesterol levels checked more frequently.Ongoing high lipid and cholesterol levels should be treated with medicines if diet and exercise are not working.  If you smoke, find out from your health care provider how to quit. If you do not use tobacco, do not start.  Lung cancer screening is recommended for adults aged 55-80 years who are at high risk for developing lung cancer because of a history of smoking. A yearly low-dose CT scan of the lungs is recommended for people who have at least a 30-pack-year history of smoking and are a current smoker or have quit within the past 15 years. A pack year of smoking is smoking an average of 1 pack of cigarettes a day for 1 year (for example: 1 pack a day for 30 years or 2 packs a day for 15 years). Yearly screening should continue until the smoker has stopped smoking for at least 15 years. Yearly screening should be stopped for people who develop a health problem that would prevent them from having lung cancer treatment.  If you are pregnant, do not drink alcohol. If you are breastfeeding,   be very cautious about drinking alcohol. If you are not pregnant and choose to drink alcohol, do not have more than 1 drink per day. One drink is considered to be 12 ounces (355 mL) of beer, 5 ounces (148 mL) of wine, or 1.5 ounces (44 mL) of liquor.  Avoid use of street drugs. Do not share needles with anyone. Ask for help if you need support or instructions about stopping the use of drugs.  High blood pressure causes heart disease and increases the risk of  stroke. Your blood pressure should be checked at least every 1 to 2 years. Ongoing high blood pressure should be treated with medicines if weight loss and exercise do not work.  If you are 75-52 years old, ask your health care provider if you should take aspirin to prevent strokes.  Diabetes screening involves taking a blood sample to check your fasting blood sugar level. This should be done once every 3 years, after age 15, if you are within normal weight and without risk factors for diabetes. Testing should be considered at a younger age or be carried out more frequently if you are overweight and have at least 1 risk factor for diabetes.  Breast cancer screening is essential preventive care for women. You should practice "breast self-awareness." This means understanding the normal appearance and feel of your breasts and may include breast self-examination. Any changes detected, no matter how small, should be reported to a health care provider. Women in their 58s and 30s should have a clinical breast exam (CBE) by a health care provider as part of a regular health exam every 1 to 3 years. After age 16, women should have a CBE every year. Starting at age 53, women should consider having a mammogram (breast X-ray test) every year. Women who have a family history of breast cancer should talk to their health care provider about genetic screening. Women at a high risk of breast cancer should talk to their health care providers about having an MRI and a mammogram every year.  Breast cancer gene (BRCA)-related cancer risk assessment is recommended for women who have family members with BRCA-related cancers. BRCA-related cancers include breast, ovarian, tubal, and peritoneal cancers. Having family members with these cancers may be associated with an increased risk for harmful changes (mutations) in the breast cancer genes BRCA1 and BRCA2. Results of the assessment will determine the need for genetic counseling and  BRCA1 and BRCA2 testing.  Routine pelvic exams to screen for cancer are no longer recommended for nonpregnant women who are considered low risk for cancer of the pelvic organs (ovaries, uterus, and vagina) and who do not have symptoms. Ask your health care provider if a screening pelvic exam is right for you.  If you have had past treatment for cervical cancer or a condition that could lead to cancer, you need Pap tests and screening for cancer for at least 20 years after your treatment. If Pap tests have been discontinued, your risk factors (such as having a new sexual partner) need to be reassessed to determine if screening should be resumed. Some women have medical problems that increase the chance of getting cervical cancer. In these cases, your health care provider may recommend more frequent screening and Pap tests.  The HPV test is an additional test that may be used for cervical cancer screening. The HPV test looks for the virus that can cause the cell changes on the cervix. The cells collected during the Pap test can be  tested for HPV. The HPV test could be used to screen women aged 30 years and older, and should be used in women of any age who have unclear Pap test results. After the age of 30, women should have HPV testing at the same frequency as a Pap test.  Colorectal cancer can be detected and often prevented. Most routine colorectal cancer screening begins at the age of 50 years and continues through age 75 years. However, your health care provider may recommend screening at an earlier age if you have risk factors for colon cancer. On a yearly basis, your health care provider may provide home test kits to check for hidden blood in the stool. Use of a small camera at the end of a tube, to directly examine the colon (sigmoidoscopy or colonoscopy), can detect the earliest forms of colorectal cancer. Talk to your health care provider about this at age 50, when routine screening begins. Direct  exam of the colon should be repeated every 5-10 years through age 75 years, unless early forms of pre-cancerous polyps or small growths are found.  People who are at an increased risk for hepatitis B should be screened for this virus. You are considered at high risk for hepatitis B if:  You were born in a country where hepatitis B occurs often. Talk with your health care provider about which countries are considered high risk.  Your parents were born in a high-risk country and you have not received a shot to protect against hepatitis B (hepatitis B vaccine).  You have HIV or AIDS.  You use needles to inject street drugs.  You live with, or have sex with, someone who has hepatitis B.  You get hemodialysis treatment.  You take certain medicines for conditions like cancer, organ transplantation, and autoimmune conditions.  Hepatitis C blood testing is recommended for all people born from 1945 through 1965 and any individual with known risks for hepatitis C.  Practice safe sex. Use condoms and avoid high-risk sexual practices to reduce the spread of sexually transmitted infections (STIs). STIs include gonorrhea, chlamydia, syphilis, trichomonas, herpes, HPV, and human immunodeficiency virus (HIV). Herpes, HIV, and HPV are viral illnesses that have no cure. They can result in disability, cancer, and death.  You should be screened for sexually transmitted illnesses (STIs) including gonorrhea and chlamydia if:  You are sexually active and are younger than 24 years.  You are older than 24 years and your health care provider tells you that you are at risk for this type of infection.  Your sexual activity has changed since you were last screened and you are at an increased risk for chlamydia or gonorrhea. Ask your health care provider if you are at risk.  If you are at risk of being infected with HIV, it is recommended that you take a prescription medicine daily to prevent HIV infection. This is  called preexposure prophylaxis (PrEP). You are considered at risk if:  You are a heterosexual woman, are sexually active, and are at increased risk for HIV infection.  You take drugs by injection.  You are sexually active with a partner who has HIV.  Talk with your health care provider about whether you are at high risk of being infected with HIV. If you choose to begin PrEP, you should first be tested for HIV. You should then be tested every 3 months for as long as you are taking PrEP.  Osteoporosis is a disease in which the bones lose minerals and strength   with aging. This can result in serious bone fractures or breaks. The risk of osteoporosis can be identified using a bone density scan. Women ages 65 years and over and women at risk for fractures or osteoporosis should discuss screening with their health care providers. Ask your health care provider whether you should take a calcium supplement or vitamin D to reduce the rate of osteoporosis.  Menopause can be associated with physical symptoms and risks. Hormone replacement therapy is available to decrease symptoms and risks. You should talk to your health care provider about whether hormone replacement therapy is right for you.  Use sunscreen. Apply sunscreen liberally and repeatedly throughout the day. You should seek shade when your shadow is shorter than you. Protect yourself by wearing long sleeves, pants, a wide-brimmed hat, and sunglasses year round, whenever you are outdoors.  Once a month, do a whole body skin exam, using a mirror to look at the skin on your back. Tell your health care provider of new moles, moles that have irregular borders, moles that are larger than a pencil eraser, or moles that have changed in shape or color.  Stay current with required vaccines (immunizations).  Influenza vaccine. All adults should be immunized every year.  Tetanus, diphtheria, and acellular pertussis (Td, Tdap) vaccine. Pregnant women should  receive 1 dose of Tdap vaccine during each pregnancy. The dose should be obtained regardless of the length of time since the last dose. Immunization is preferred during the 27th-36th week of gestation. An adult who has not previously received Tdap or who does not know her vaccine status should receive 1 dose of Tdap. This initial dose should be followed by tetanus and diphtheria toxoids (Td) booster doses every 10 years. Adults with an unknown or incomplete history of completing a 3-dose immunization series with Td-containing vaccines should begin or complete a primary immunization series including a Tdap dose. Adults should receive a Td booster every 10 years.  Varicella vaccine. An adult without evidence of immunity to varicella should receive 2 doses or a second dose if she has previously received 1 dose. Pregnant females who do not have evidence of immunity should receive the first dose after pregnancy. This first dose should be obtained before leaving the health care facility. The second dose should be obtained 4-8 weeks after the first dose.  Human papillomavirus (HPV) vaccine. Females aged 13-26 years who have not received the vaccine previously should obtain the 3-dose series. The vaccine is not recommended for use in pregnant females. However, pregnancy testing is not needed before receiving a dose. If a female is found to be pregnant after receiving a dose, no treatment is needed. In that case, the remaining doses should be delayed until after the pregnancy. Immunization is recommended for any person with an immunocompromised condition through the age of 26 years if she did not get any or all doses earlier. During the 3-dose series, the second dose should be obtained 4-8 weeks after the first dose. The third dose should be obtained 24 weeks after the first dose and 16 weeks after the second dose.  Zoster vaccine. One dose is recommended for adults aged 60 years or older unless certain conditions are  present.  Measles, mumps, and rubella (MMR) vaccine. Adults born before 1957 generally are considered immune to measles and mumps. Adults born in 1957 or later should have 1 or more doses of MMR vaccine unless there is a contraindication to the vaccine or there is laboratory evidence of immunity to   each of the three diseases. A routine second dose of MMR vaccine should be obtained at least 28 days after the first dose for students attending postsecondary schools, health care workers, or international travelers. People who received inactivated measles vaccine or an unknown type of measles vaccine during 1963-1967 should receive 2 doses of MMR vaccine. People who received inactivated mumps vaccine or an unknown type of mumps vaccine before 1979 and are at high risk for mumps infection should consider immunization with 2 doses of MMR vaccine. For females of childbearing age, rubella immunity should be determined. If there is no evidence of immunity, females who are not pregnant should be vaccinated. If there is no evidence of immunity, females who are pregnant should delay immunization until after pregnancy. Unvaccinated health care workers born before 1957 who lack laboratory evidence of measles, mumps, or rubella immunity or laboratory confirmation of disease should consider measles and mumps immunization with 2 doses of MMR vaccine or rubella immunization with 1 dose of MMR vaccine.  Pneumococcal 13-valent conjugate (PCV13) vaccine. When indicated, a person who is uncertain of her immunization history and has no record of immunization should receive the PCV13 vaccine. An adult aged 19 years or older who has certain medical conditions and has not been previously immunized should receive 1 dose of PCV13 vaccine. This PCV13 should be followed with a dose of pneumococcal polysaccharide (PPSV23) vaccine. The PPSV23 vaccine dose should be obtained at least 8 weeks after the dose of PCV13 vaccine. An adult aged 19  years or older who has certain medical conditions and previously received 1 or more doses of PPSV23 vaccine should receive 1 dose of PCV13. The PCV13 vaccine dose should be obtained 1 or more years after the last PPSV23 vaccine dose.  Pneumococcal polysaccharide (PPSV23) vaccine. When PCV13 is also indicated, PCV13 should be obtained first. All adults aged 65 years and older should be immunized. An adult younger than age 65 years who has certain medical conditions should be immunized. Any person who resides in a nursing home or long-term care facility should be immunized. An adult smoker should be immunized. People with an immunocompromised condition and certain other conditions should receive both PCV13 and PPSV23 vaccines. People with human immunodeficiency virus (HIV) infection should be immunized as soon as possible after diagnosis. Immunization during chemotherapy or radiation therapy should be avoided. Routine use of PPSV23 vaccine is not recommended for American Indians, [place] Natives, or people younger than 65 years unless there are medical conditions that require PPSV23 vaccine. When indicated, people who have unknown immunization and have no record of immunization should receive PPSV23 vaccine. One-time revaccination 5 years after the first dose of PPSV23 is recommended for people aged 19-64 years who have chronic kidney failure, nephrotic syndrome, asplenia, or immunocompromised conditions. People who received 1-2 doses of PPSV23 before age 65 years should receive another dose of PPSV23 vaccine at age 65 years or later if at least 5 years have passed since the previous dose. Doses of PPSV23 are not needed for people immunized with PPSV23 at or after age 65 years.  Meningococcal vaccine. Adults with asplenia or persistent complement component deficiencies should receive 2 doses of quadrivalent meningococcal conjugate (MenACWY-D) vaccine. The doses should be obtained at least 2 months apart.  Microbiologists working with certain meningococcal bacteria, military recruits, people at risk during an outbreak, and people who travel to or live in countries with a high rate of meningitis should be immunized. A first-year college student up through age   21 years who is living in a residence hall should receive a dose if she did not receive a dose on or after her 16th birthday. Adults who have certain high-risk conditions should receive one or more doses of vaccine.  Hepatitis A vaccine. Adults who wish to be protected from this disease, have certain high-risk conditions, work with hepatitis A-infected animals, work in hepatitis A research labs, or travel to or work in countries with a high rate of hepatitis A should be immunized. Adults who were previously unvaccinated and who anticipate close contact with an international adoptee during the first 60 days after arrival in the [place] from a country with a high rate of hepatitis A should be immunized.  Hepatitis B vaccine. Adults who wish to be protected from this disease, have certain high-risk conditions, may be exposed to blood or other infectious body fluids, are household contacts or sex partners of hepatitis B positive people, are clients or workers in certain care facilities, or travel to or work in countries with a high rate of hepatitis B should be immunized.  Haemophilus influenzae type b (Hib) vaccine. A previously unvaccinated person with asplenia or sickle cell disease or having a scheduled splenectomy should receive 1 dose of Hib vaccine. Regardless of previous immunization, a recipient of a hematopoietic stem cell transplant should receive a 3-dose series 6-12 months after her successful transplant. Hib vaccine is not recommended for adults with HIV infection. Preventive Services / Frequency Ages 64 to 68 years  Blood pressure check.** / Every 1 to 2 years.  Lipid and cholesterol check.** / Every 5 years beginning at age  22.  Clinical breast exam.** / Every 3 years for women in their 88s and 53s.  BRCA-related cancer risk assessment.** / For women who have family members with a BRCA-related cancer (breast, ovarian, tubal, or peritoneal cancers).  Pap test.** / Every 2 years from ages 90 through 51. Every 3 years starting at age 21 through age 56 or 3 with a history of 3 consecutive normal Pap tests.  HPV screening.** / Every 3 years from ages 24 through ages 1 to 46 with a history of 3 consecutive normal Pap tests.  Hepatitis C blood test.** / For any individual with known risks for hepatitis C.  Skin self-exam. / Monthly.  Influenza vaccine. / Every year.  Tetanus, diphtheria, and acellular pertussis (Tdap, Td) vaccine.** / Consult your health care provider. Pregnant women should receive 1 dose of Tdap vaccine during each pregnancy. 1 dose of Td every 10 years.  Varicella vaccine.** / Consult your health care provider. Pregnant females who do not have evidence of immunity should receive the first dose after pregnancy.  HPV vaccine. / 3 doses over 6 months, if 72 and younger. The vaccine is not recommended for use in pregnant females. However, pregnancy testing is not needed before receiving a dose.  Measles, mumps, rubella (MMR) vaccine.** / You need at least 1 dose of MMR if you were born in 1957 or later. You may also need a 2nd dose. For females of childbearing age, rubella immunity should be determined. If there is no evidence of immunity, females who are not pregnant should be vaccinated. If there is no evidence of immunity, females who are pregnant should delay immunization until after pregnancy.  Pneumococcal 13-valent conjugate (PCV13) vaccine.** / Consult your health care provider.  Pneumococcal polysaccharide (PPSV23) vaccine.** / 1 to 2 doses if you smoke cigarettes or if you have certain conditions.  Meningococcal vaccine.** /  1 dose if you are age 19 to 21 years and a first-year college  student living in a residence hall, or have one of several medical conditions, you need to get vaccinated against meningococcal disease. You may also need additional booster doses.  Hepatitis A vaccine.** / Consult your health care provider.  Hepatitis B vaccine.** / Consult your health care provider.  Haemophilus influenzae type b (Hib) vaccine.** / Consult your health care provider. Ages 40 to 64 years  Blood pressure check.** / Every 1 to 2 years.  Lipid and cholesterol check.** / Every 5 years beginning at age 20 years.  Lung cancer screening. / Every year if you are aged 55-80 years and have a 30-pack-year history of smoking and currently smoke or have quit within the past 15 years. Yearly screening is stopped once you have quit smoking for at least 15 years or develop a health problem that would prevent you from having lung cancer treatment.  Clinical breast exam.** / Every year after age 40 years.  BRCA-related cancer risk assessment.** / For women who have family members with a BRCA-related cancer (breast, ovarian, tubal, or peritoneal cancers).  Mammogram.** / Every year beginning at age 40 years and continuing for as long as you are in good health. Consult with your health care provider.  Pap test.** / Every 3 years starting at age 30 years through age 65 or 70 years with a history of 3 consecutive normal Pap tests.  HPV screening.** / Every 3 years from ages 30 years through ages 65 to 70 years with a history of 3 consecutive normal Pap tests.  Fecal occult blood test (FOBT) of stool. / Every year beginning at age 50 years and continuing until age 75 years. You may not need to do this test if you get a colonoscopy every 10 years.  Flexible sigmoidoscopy or colonoscopy.** / Every 5 years for a flexible sigmoidoscopy or every 10 years for a colonoscopy beginning at age 50 years and continuing until age 75 years.  Hepatitis C blood test.** / For all people born from 1945 through  1965 and any individual with known risks for hepatitis C.  Skin self-exam. / Monthly.  Influenza vaccine. / Every year.  Tetanus, diphtheria, and acellular pertussis (Tdap/Td) vaccine.** / Consult your health care provider. Pregnant women should receive 1 dose of Tdap vaccine during each pregnancy. 1 dose of Td every 10 years.  Varicella vaccine.** / Consult your health care provider. Pregnant females who do not have evidence of immunity should receive the first dose after pregnancy.  Zoster vaccine.** / 1 dose for adults aged 60 years or older.  Measles, mumps, rubella (MMR) vaccine.** / You need at least 1 dose of MMR if you were born in 1957 or later. You may also need a 2nd dose. For females of childbearing age, rubella immunity should be determined. If there is no evidence of immunity, females who are not pregnant should be vaccinated. If there is no evidence of immunity, females who are pregnant should delay immunization until after pregnancy.  Pneumococcal 13-valent conjugate (PCV13) vaccine.** / Consult your health care provider.  Pneumococcal polysaccharide (PPSV23) vaccine.** / 1 to 2 doses if you smoke cigarettes or if you have certain conditions.  Meningococcal vaccine.** / Consult your health care provider.  Hepatitis A vaccine.** / Consult your health care provider.  Hepatitis B vaccine.** / Consult your health care provider.  Haemophilus influenzae type b (Hib) vaccine.** / Consult your health care provider. Ages 65   years and over  Blood pressure check.** / Every 1 to 2 years.  Lipid and cholesterol check.** / Every 5 years beginning at age 22 years.  Lung cancer screening. / Every year if you are aged 73-80 years and have a 30-pack-year history of smoking and currently smoke or have quit within the past 15 years. Yearly screening is stopped once you have quit smoking for at least 15 years or develop a health problem that would prevent you from having lung cancer  treatment.  Clinical breast exam.** / Every year after age 4 years.  BRCA-related cancer risk assessment.** / For women who have family members with a BRCA-related cancer (breast, ovarian, tubal, or peritoneal cancers).  Mammogram.** / Every year beginning at age 40 years and continuing for as long as you are in good health. Consult with your health care provider.  Pap test.** / Every 3 years starting at age 9 years through age 34 or 91 years with 3 consecutive normal Pap tests. Testing can be stopped between 65 and 70 years with 3 consecutive normal Pap tests and no abnormal Pap or HPV tests in the past 10 years.  HPV screening.** / Every 3 years from ages 57 years through ages 64 or 45 years with a history of 3 consecutive normal Pap tests. Testing can be stopped between 65 and 70 years with 3 consecutive normal Pap tests and no abnormal Pap or HPV tests in the past 10 years.  Fecal occult blood test (FOBT) of stool. / Every year beginning at age 15 years and continuing until age 17 years. You may not need to do this test if you get a colonoscopy every 10 years.  Flexible sigmoidoscopy or colonoscopy.** / Every 5 years for a flexible sigmoidoscopy or every 10 years for a colonoscopy beginning at age 86 years and continuing until age 71 years.  Hepatitis C blood test.** / For all people born from [birth year] through 1965 and any individual with known risks for hepatitis C.  Osteoporosis screening.** / A one-time screening for women ages 83 years and over and women at risk for fractures or osteoporosis.  Skin self-exam. / Monthly.  Influenza vaccine. / Every year.  Tetanus, diphtheria, and acellular pertussis (Tdap/Td) vaccine.** / 1 dose of Td every 10 years.  Varicella vaccine.** / Consult your health care provider.  Zoster vaccine.** / 1 dose for adults aged 61 years or older.  Pneumococcal 13-valent conjugate (PCV13) vaccine.** / Consult your health care provider.  Pneumococcal  polysaccharide (PPSV23) vaccine.** / 1 dose for all adults aged 28 years and older.  Meningococcal vaccine.** / Consult your health care provider.  Hepatitis A vaccine.** / Consult your health care provider.  Hepatitis B vaccine.** / Consult your health care provider.  Haemophilus influenzae type b (Hib) vaccine.** / Consult your health care provider. ** Family history and personal history of risk and conditions may change your health care provider's recommendations. Document Released: 09/06/2001 Document Revised: 11/25/2013 Document Reviewed: 12/06/2010 [facility] Patient Information 2015 [place], [place]. This information is not intended to replace advice given to you by your health care provider. Make sure you discuss any questions you have with your health care provider.

## 2014-11-20 NOTE — Progress Notes (Signed)
Pre visit review using our clinic review tool, if applicable. No additional management support is needed unless otherwise documented below in the visit note. 

## 2014-11-21 LAB — COMPREHENSIVE METABOLIC PANEL
ALBUMIN: 4.3 g/dL (ref 3.5–5.2)
ALK PHOS: 61 U/L (ref 39–117)
ALT: 13 U/L (ref 0–35)
AST: 19 U/L (ref 0–37)
BUN: 9 mg/dL (ref 6–23)
CO2: 27 meq/L (ref 19–32)
CREATININE: 0.89 mg/dL (ref 0.40–1.20)
Calcium: 9.6 mg/dL (ref 8.4–10.5)
Chloride: 106 mEq/L (ref 96–112)
GFR: 71.83 mL/min (ref >60.00)
Glucose, Bld: 59 mg/dL — ABNORMAL LOW (ref 70–99)
POTASSIUM: 3.6 meq/L (ref 3.5–5.1)
Sodium: 141 mEq/L (ref 135–145)
Total Bilirubin: 0.4 mg/dL (ref 0.2–1.2)
Total Protein: 7.4 g/dL (ref 6.0–8.3)

## 2014-11-21 LAB — CBC
HEMATOCRIT: 42.9 % (ref 36.0–46.0)
Hemoglobin: 14.7 g/dL (ref 12.0–15.0)
MCHC: 34.2 g/dL (ref 30.0–36.0)
MCV: 87 fl (ref 78.0–100.0)
Platelets: 312 10*3/uL (ref 150.0–400.0)
RBC: 4.93 Mil/uL (ref 3.87–5.11)
RDW: 12.6 % (ref 11.5–15.5)
WBC: 6.7 10*3/uL (ref 4.0–10.5)

## 2014-11-21 LAB — LIPID PANEL
Cholesterol: 186 mg/dL (ref 0–200)
HDL: 38.6 mg/dL — ABNORMAL LOW (ref >39.00)
LDL CALC: 129 mg/dL — AB (ref 0–99)
NONHDL: 147.4
Total CHOL/HDL Ratio: 5
Triglycerides: 91 mg/dL (ref 0.0–149.0)
VLDL: 18.2 mg/dL (ref 0.0–40.0)

## 2014-11-30 ENCOUNTER — Encounter: Payer: Self-pay | Admitting: Family Medicine

## 2014-11-30 NOTE — Assessment & Plan Note (Signed)
Patient encouraged to maintain heart healthy diet, regular exercise, adequate sleep. Consider daily probiotics. Take medications as prescribed. Labs ordered and reviewed 

## 2014-11-30 NOTE — Assessment & Plan Note (Signed)
Encouraged heart healthy diet, increase exercise, avoid trans fats, consider a krill oil cap daily 

## 2014-11-30 NOTE — Assessment & Plan Note (Signed)
Avoid offending foods, continue probiotics. Do not eat large meals in late evening and consider raising head of bed.  

## 2014-11-30 NOTE — Progress Notes (Signed)
Kendra Bailey  [id number] 01/12/1966 11/30/2014      Progress Note-Follow Up  Subjective  Chief Complaint  Chief Complaint  Patient presents with  . Annual Exam    HPI  Patient is a 49 y.o. female in today for routine medical care. Patient doing well. No recent illness or acute concerns. No recent trips to the hospital. Allergies and heartburn manageable at this time. Denies CP/palp/SOB/HA/congestion/fevers/GI or GU c/o. Taking meds as prescribed  Past Medical History  Diagnosis Date  . History of chicken pox 11/30/2010  . History of strep sore throat 11/30/2010  . Perimenopause 11/30/2010  . Insomnia 11/30/2010  . Fatigue 11/30/2010  . Biceps strain 11/30/2010  . Knee pain, bilateral 11/30/2010  . History of fibrocystic disease of breast 11/30/2010  . Calf pain 08/12/2011  . Otitis media of right ear 04/22/2012  . Allergic state 04/22/2012  . Cervical cancer screening 06/18/2012  . Hyperlipidemia 06/18/2012  . Preventative health care 06/18/2012  . Gastro-esophageal reflux 10/18/2013  . Post-menopause 11/30/2010  . Onychomycosis 10/20/2013    Right great toe    History reviewed. No pertinent past surgical history.  Family History  Problem Relation Age of Onset  . Emphysema Father     smoker  . COPD Father 7170    emphysema, smoker  . Hyperlipidemia Father   . Multiple sclerosis Sister   . ADD / ADHD Son     ADHD  . Hypertension Maternal Grandmother   . Alzheimer's disease Maternal Grandmother   . Cancer Paternal Grandmother   . Alzheimer's disease Paternal Grandfather   . Heart disease Mother     cardiomegaly, possible valvular insufficiencyt  . Hyperlipidemia Mother   . Hypertension Mother     History   Social History  . Marital Status: Married    Spouse Name: N/A  . Number of Children: 3  . Years of Education: N/A   Occupational History  . stay at home mom    Social History Main Topics  . Smoking status: Never Smoker   . Smokeless tobacco: Never Used  .  Alcohol Use: Yes     Comment: occasional- wine  . Drug Use: No  . Sexual Activity: Yes   Other Topics Concern  . Not on file   Social History Narrative    Current Outpatient Prescriptions on File Prior to Visit  Medication Sig Dispense Refill  . Calcium Carbonate-Vit D-Min (CALCIUM 1200 PO) Take by mouth daily.    . multivitamin (THERAGRAN) tablet Take 1 tablet by mouth daily.       No current facility-administered medications on file prior to visit.    Allergies  Allergen Reactions  . Penicillins Rash    Review of Systems  Review of Systems  Constitutional: Negative for fever and malaise/fatigue.  HENT: Negative for congestion.   Eyes: Negative for discharge.  Respiratory: Negative for shortness of breath.   Cardiovascular: Negative for chest pain, palpitations and leg swelling.  Gastrointestinal: Negative for nausea, abdominal pain and diarrhea.  Genitourinary: Negative for dysuria.  Musculoskeletal: Negative for falls.  Skin: Negative for rash.  Neurological: Negative for loss of consciousness and headaches.  Endo/Heme/Allergies: Negative for polydipsia.  Psychiatric/Behavioral: Negative for depression and suicidal ideas. The patient is not nervous/anxious and does not have insomnia.     Objective  BP 120/70 mmHg  Pulse 73  Temp(Src) 97.8 F (36.6 C) (Oral)  Ht 5\' 5"  (1.651 m)  Wt 168 lb (76.204 kg)  BMI 27.96 kg/m2  SpO2 98%  Physical Exam  Physical Exam  Constitutional: She is oriented to person, place, and time and well-developed, well-nourished, and in no distress. No distress.  HENT:  Head: Normocephalic and atraumatic.  Right Ear: External ear normal.  Left Ear: External ear normal.  Nose: Nose normal.  Mouth/Throat: Oropharynx is clear and moist. No oropharyngeal exudate.  Eyes: Conjunctivae are normal. Pupils are equal, round, and reactive to light. Right eye exhibits no discharge. Left eye exhibits no discharge. No scleral icterus.  Neck:  Normal range of motion. Neck supple. No thyromegaly present.  Cardiovascular: Normal rate, regular rhythm, normal heart sounds and intact distal pulses.   No murmur heard. Pulmonary/Chest: Effort normal and breath sounds normal. No respiratory distress. She has no wheezes. She has no rales.  Abdominal: Soft. Bowel sounds are normal. She exhibits no distension and no mass. There is no tenderness.  Musculoskeletal: Normal range of motion. She exhibits no edema or tenderness.  Lymphadenopathy:    She has no cervical adenopathy.  Neurological: She is alert and oriented to person, place, and time. She has normal reflexes. No cranial nerve deficit. Coordination normal.  Skin: Skin is warm and dry. No rash noted. She is not diaphoretic.  Psychiatric: Mood, memory and affect normal.    Lab Results  Component Value Date   TSH 2.276 10/11/2013   Lab Results  Component Value Date   WBC 6.7 11/20/2014   HGB 14.7 11/20/2014   HCT 42.9 11/20/2014   MCV 87.0 11/20/2014   PLT 312.0 11/20/2014   Lab Results  Component Value Date   CREATININE 0.89 11/20/2014   BUN 9 11/20/2014   NA 141 11/20/2014   K 3.6 11/20/2014   CL 106 11/20/2014   CO2 27 11/20/2014   Lab Results  Component Value Date   ALT 13 11/20/2014   AST 19 11/20/2014   ALKPHOS 61 11/20/2014   BILITOT 0.4 11/20/2014   Lab Results  Component Value Date   CHOL 186 11/20/2014   Lab Results  Component Value Date   HDL 38.60* 11/20/2014   Lab Results  Component Value Date   LDLCALC 129* 11/20/2014   Lab Results  Component Value Date   TRIG 91.0 11/20/2014   Lab Results  Component Value Date   CHOLHDL 5 11/20/2014     Assessment & Plan  Gastro-esophageal reflux Avoid offending foods, continue probiotics. Do not eat large meals in late evening and consider raising head of bed.    Preventative health care Patient encouraged to maintain heart healthy diet, regular exercise, adequate sleep. Consider daily  probiotics. Take medications as prescribed. Labs ordered and reviewed   Hyperlipidemia Encouraged heart healthy diet, increase exercise, avoid trans fats, consider a krill oil cap daily

## 2015-01-28 ENCOUNTER — Telehealth: Payer: Self-pay | Admitting: Family Medicine

## 2015-01-28 NOTE — Telephone Encounter (Signed)
Caller name: Shalom  Relation to pt: Call back number: [phone] Pharmacy:  Reason for call:   Patient states that she is having heel pain and wants to know if Dr. Abner GreenspanBlyth could recommend a podiatrist?

## 2015-01-29 NOTE — Telephone Encounter (Signed)
Found it she can see [facility] here, Dr Pearletha ForgeHudnall also treats heal spurs.

## 2015-01-30 NOTE — Telephone Encounter (Signed)
Patient informed. 

## 2015-07-06 ENCOUNTER — Encounter: Payer: Self-pay | Admitting: Family Medicine

## 2015-07-06 ENCOUNTER — Ambulatory Visit (INDEPENDENT_AMBULATORY_CARE_PROVIDER_SITE_OTHER): Payer: BLUE CROSS/BLUE SHIELD | Admitting: Family Medicine

## 2015-07-06 VITALS — BP 118/78 | HR 92 | Temp 98.4°F | Resp 17 | Ht 65.0 in | Wt 164.1 lb

## 2015-07-06 DIAGNOSIS — J069 Acute upper respiratory infection, unspecified: Secondary | ICD-10-CM | POA: Insufficient documentation

## 2015-07-06 MED ORDER — PROMETHAZINE-DM 6.25-15 MG/5ML PO SYRP: 5.0000 mL | ORAL_SOLUTION | Freq: Four times a day (QID) | ORAL | Status: DC | PRN

## 2015-07-06 NOTE — Progress Notes (Signed)
Pre visit review using our clinic review tool, if applicable. No additional management support is needed unless otherwise documented below in the visit note. 

## 2015-07-06 NOTE — Progress Notes (Signed)
   Subjective:    Patient ID: Kendra Bailey, female    DOB: 08/16/1965, 49 y.o.   MRN: [id number]  HPI Cough- 'i just want to make sure I don't have PNA'.  sxs started 10 days ago but 'i started feeling really bad' 6 days ago.  + fever, Tm 100.  + sick contacts.  Has not started Zpack that she was [facility].  No sinus pain/pressure.  + chest tightness, fatigue, productive cough- yellow sputum.  + SOB.  + nausea, no vomiting.   Review of Systems For ROS see HPI     Objective:   Physical Exam  Constitutional: She appears well-developed and well-nourished. No distress.  HENT:  Head: Normocephalic and atraumatic.  TMs normal bilaterally Mild nasal congestion Throat w/out erythema, edema, or exudate  Eyes: Conjunctivae and EOM are normal. Pupils are equal, round, and reactive to light.  Neck: Normal range of motion. Neck supple.  Cardiovascular: Normal rate, regular rhythm, normal heart sounds and intact distal pulses.   No murmur heard. Pulmonary/Chest: Effort normal and breath sounds normal. No respiratory distress. She has no wheezes.  + hacking cough  Lymphadenopathy:    She has no cervical adenopathy.  Vitals reviewed.         Assessment & Plan:

## 2015-07-06 NOTE — Assessment & Plan Note (Signed)
New.  Pt's sxs consistent w/ bronchitis or possibly early PNA.  Encouraged her to start the Zpack given her to her at [facility].  Cough meds prn.  Reviewed supportive care and red flags that should prompt return.  Pt expressed understanding and is in agreement w/ plan.

## 2015-07-06 NOTE — Patient Instructions (Signed)
Follow up as needed Start the Zpack as directed Drink plenty of fluids REST! Mucinex DM for daytime cough Cough syrup for nights/weekends- will cause drowsiness Call with any questions or concerns Hang in there! Happy [birth year] Birth[employer]!

## 2015-07-13 ENCOUNTER — Ambulatory Visit (HOSPITAL_BASED_OUTPATIENT_CLINIC_OR_DEPARTMENT_OTHER)
Admission: RE | Admit: 2015-07-13 | Discharge: 2015-07-13 | Disposition: A | Discharge status: Home / Self Care | Payer: BLUE CROSS/BLUE SHIELD | Source: Ambulatory Visit | Attending: Family Medicine | Admitting: Family Medicine

## 2015-07-13 ENCOUNTER — Ambulatory Visit (INDEPENDENT_AMBULATORY_CARE_PROVIDER_SITE_OTHER): Payer: BLUE CROSS/BLUE SHIELD | Admitting: Family Medicine

## 2015-07-13 ENCOUNTER — Encounter: Payer: Self-pay | Admitting: Family Medicine

## 2015-07-13 ENCOUNTER — Telehealth: Payer: Self-pay | Admitting: Family Medicine

## 2015-07-13 ENCOUNTER — Encounter: Payer: Self-pay | Admitting: General Practice

## 2015-07-13 VITALS — BP 120/78 | HR 76 | Temp 97.9°F | Resp 17 | Ht 65.0 in | Wt 163.0 lb

## 2015-07-13 DIAGNOSIS — R05 Cough: Secondary | ICD-10-CM

## 2015-07-13 DIAGNOSIS — R0989 Other specified symptoms and signs involving the circulatory and respiratory systems: Secondary | ICD-10-CM | POA: Insufficient documentation

## 2015-07-13 DIAGNOSIS — R059 Cough, unspecified: Secondary | ICD-10-CM

## 2015-07-13 DIAGNOSIS — R5383 Other fatigue: Secondary | ICD-10-CM | POA: Diagnosis not present

## 2015-07-13 MED ORDER — ALBUTEROL SULFATE HFA 108 (90 BASE) MCG/ACT IN AERS: 2.0000 | INHALATION_SPRAY | Freq: Four times a day (QID) | RESPIRATORY_TRACT | Status: DC | PRN

## 2015-07-13 MED ORDER — ALBUTEROL SULFATE (2.5 MG/3ML) 0.083% IN NEBU
2.5000 mg | INHALATION_SOLUTION | Freq: Once | RESPIRATORY_TRACT | Status: AC
  Administered 2015-07-13: 2.5 mg via RESPIRATORY_TRACT

## 2015-07-13 NOTE — Assessment & Plan Note (Addendum)
Ongoing.  Pt completed her Zpack as directed.  Reports cough returned on Sunday but never resolved.  Suspect cough has a component of airway inflammation due to recent illness.  No evidence of current infxn- normal CXR.  Continue cough syrup.  Add Albuterol inhaler PRN as cough improved s/p neb in office.  Reviewed supportive care and red flags that should prompt return.  Pt expressed understanding and is in agreement w/ plan.

## 2015-07-13 NOTE — Telephone Encounter (Signed)
Spoke with pt, she finished ABX. Chest xray ordered and pt scheduled at 2pm to see tabori

## 2015-07-13 NOTE — Assessment & Plan Note (Signed)
New to provider, recurrent for pt.  Currently on thyroid medication.  Check labs to determine if adjustments need to be made but I suspect her fatigue is due to her current/resolving illness.  Encouraged pt to rest and allow time to recover.  Will follow.

## 2015-07-13 NOTE — Telephone Encounter (Signed)
If no improvement, pt will need a chest xray to assess her cough.  Did she complete the Zpack as recommended?

## 2015-07-13 NOTE — Progress Notes (Signed)
   Subjective:    Patient ID: Kendra Bailey, female    DOB: 03/15/1966, 49 y.o.   MRN: [id number]  HPI Cough- pt was seen 1 week ago and tx'd w/ Zpack for bronchitis/walking PNA.  'i've just been so tired'.  Pt doesn't feel cough is improving- 'i thought it was but then Sunday it started again'.  Cough medicine is helping pt to sleep.  No sinus pain or pressure.  No fevers.  Normal CXR earlier today.   Review of Systems For ROS see HPI     Objective:   Physical Exam  Constitutional: She is oriented to person, place, and time. She appears well-developed and well-nourished. No distress.  HENT:  Head: Normocephalic and atraumatic.  TMs normal bilaterally Mild nasal congestion Throat w/out erythema, edema, or exudate  Eyes: Conjunctivae and EOM are normal. Pupils are equal, round, and reactive to light.  Neck: Normal range of motion. Neck supple.  Cardiovascular: Normal rate, regular rhythm, normal heart sounds and intact distal pulses.   No murmur heard. Pulmonary/Chest: Effort normal and breath sounds normal. No respiratory distress. She has no wheezes.  + hacking cough  Lymphadenopathy:    She has no cervical adenopathy.  Neurological: She is alert and oriented to person, place, and time.  Skin: Skin is warm and dry.  Psychiatric: She has a normal mood and affect. Her behavior is normal. Thought content normal.  Vitals reviewed.         Assessment & Plan:

## 2015-07-13 NOTE — Progress Notes (Signed)
Pre visit review using our clinic review tool, if applicable. No additional management support is needed unless otherwise documented below in the visit note. 

## 2015-07-13 NOTE — Telephone Encounter (Signed)
Relation to ZO:XWRUpt:self Call back [phone]   Reason for call:  Patient states symptoms have not improved from 07/06/2015 appointment, patient states she is still coughing and feels fatigue.

## 2015-07-13 NOTE — Patient Instructions (Signed)
Follow up as needed We'll notify you of your lab results and make any changes if needed Continue the cough syrup as needed Use the albuterol inhaler- 2 puffs every 4 hrs as needed for cough/shortness of breath Drink plenty of fluids REST!!!  Your body needs time to recover! Call with any questions or concerns Hang in there!!

## 2015-07-14 LAB — CBC WITH DIFFERENTIAL/PLATELET
Basophils Absolute: 0.1 10*3/uL (ref 0.0–0.1)
Basophils Relative: 0.9 % (ref 0.0–3.0)
EOS PCT: 1.4 % (ref 0.0–5.0)
Eosinophils Absolute: 0.1 10*3/uL (ref 0.0–0.7)
HCT: 44.8 % (ref 36.0–46.0)
Hemoglobin: 15 g/dL (ref 12.0–15.0)
LYMPHS ABS: 2.8 10*3/uL (ref 0.7–4.0)
Lymphocytes Relative: 26.9 % (ref 12.0–46.0)
MCHC: 33.4 g/dL (ref 30.0–36.0)
MCV: 87.6 fl (ref 78.0–100.0)
MONO ABS: 0.6 10*3/uL (ref 0.1–1.0)
Monocytes Relative: 6.1 % (ref 3.0–12.0)
NEUTROS ABS: 6.6 10*3/uL (ref 1.4–7.7)
NEUTROS PCT: 64.7 % (ref 43.0–77.0)
PLATELETS: 420 10*3/uL — AB (ref 150.0–400.0)
RBC: 5.12 Mil/uL — AB (ref 3.87–5.11)
RDW: 12.2 % (ref 11.5–15.5)
WBC: 10.3 10*3/uL (ref 4.0–10.5)

## 2015-07-14 LAB — BASIC METABOLIC PANEL
BUN: 13 mg/dL (ref 6–23)
CALCIUM: 9.4 mg/dL (ref 8.4–10.5)
CHLORIDE: 104 meq/L (ref 96–112)
CO2: 25 meq/L (ref 19–32)
CREATININE: 0.86 mg/dL (ref 0.40–1.20)
GFR: 74.53 mL/min (ref >60.00)
Glucose, Bld: 82 mg/dL (ref 70–99)
Potassium: 3.4 mEq/L — ABNORMAL LOW (ref 3.5–5.1)
Sodium: 139 mEq/L (ref 135–145)

## 2015-07-14 LAB — TSH: TSH: 0.85 u[IU]/mL (ref 0.35–4.50)

## 2015-07-16 ENCOUNTER — Encounter: Payer: Self-pay | Admitting: General Practice

## 2015-09-15 ENCOUNTER — Ambulatory Visit: Payer: BLUE CROSS/BLUE SHIELD | Admitting: Family Medicine

## 2015-09-16 ENCOUNTER — Ambulatory Visit (INDEPENDENT_AMBULATORY_CARE_PROVIDER_SITE_OTHER): Payer: BLUE CROSS/BLUE SHIELD | Admitting: Family Medicine

## 2015-09-16 ENCOUNTER — Encounter: Payer: Self-pay | Admitting: Family Medicine

## 2015-09-16 VITALS — BP 134/76 | HR 89 | Temp 98.8°F | Resp 20 | Wt 161.0 lb

## 2015-09-16 DIAGNOSIS — J111 Influenza due to unidentified influenza virus with other respiratory manifestations: Secondary | ICD-10-CM

## 2015-09-16 MED ORDER — OSELTAMIVIR PHOSPHATE 75 MG PO CAPS: 75.0000 mg | ORAL_CAPSULE | Freq: Two times a day (BID) | ORAL | Status: DC

## 2015-09-16 NOTE — Patient Instructions (Signed)

## 2015-09-16 NOTE — Progress Notes (Signed)
Patient ID: Kendra Bailey, female   DOB: 1965/11/05, 50 y.o.   MRN: [id number]    JENNETTE LEASK , Jul 03, 1966, 50 y.o., female MRN: [id number]  CC: flu-like symptoms Subjective: Pt presents for an acute OV with complaints of fatigue, cough, chills, fever, sore throat, headache, upset stomach, mylagias of 2 days duration. Her son tested flu positive yesterday.  Pt has tried motrin to ease their symptoms, last  dose at 7 am. She did not get the flu shot this year.  Inhaler use in the past with illness. tdap due this year in October.   Allergies  Allergen Reactions  . Penicillins Rash   Social History  Substance Use Topics  . Smoking status: Never Smoker   . Smokeless tobacco: Never Used  . Alcohol Use: Yes     Comment: occasional- wine   Past Medical History  Diagnosis Date  . History of chicken pox 11/30/2010  . History of strep sore throat 11/30/2010  . Perimenopause 11/30/2010  . Insomnia 11/30/2010  . Fatigue 11/30/2010  . Biceps strain 11/30/2010  . Knee pain, bilateral 11/30/2010  . History of fibrocystic disease of breast 11/30/2010  . Calf pain 08/12/2011  . Otitis media of right ear 04/22/2012  . Allergic state 04/22/2012  . Cervical cancer screening 06/18/2012  . Hyperlipidemia 06/18/2012  . Preventative health care 06/18/2012  . Gastro-esophageal reflux 10/18/2013  . Post-menopause 11/30/2010  . Onychomycosis 10/20/2013    Right great toe   History reviewed. No pertinent past surgical history. Family History  Problem Relation Age of Onset  . Emphysema Father     smoker  . COPD Father 84    emphysema, smoker  . Hyperlipidemia Father   . Multiple sclerosis Sister   . ADD / ADHD Son     ADHD  . Hypertension Maternal Grandmother   . Alzheimer's disease Maternal Grandmother   . Cancer Paternal Grandmother   . Alzheimer's disease Paternal Grandfather   . Heart disease Mother     cardiomegaly, possible valvular insufficiencyt  . Hyperlipidemia Mother   . Hypertension  Mother      Medication List       This list is accurate as of: 09/16/15  8:19 AM.  Always use your most recent med list.               albuterol 108 (90 Base) MCG/ACT inhaler  Commonly known as:  PROVENTIL HFA;VENTOLIN HFA  Inhale 2 puffs into the lungs every 6 (six) hours as needed for wheezing or shortness of breath.     benzonatate 100 MG capsule  Commonly known as:  TESSALON  Reported on 09/16/2015     Betaine HCl 300 MG Tabs  Take by mouth. 1 by mouth before each meal     CALCIUM 1200 PO  Take by mouth daily.     DHEA 50 MG Caps  Take by mouth daily.     DIGESTIVE ENZYME PO  Take by mouth 3 (three) times daily with meals.     fluticasone 50 MCG/ACT nasal spray  Commonly known as:  FLONASE  Reported on 09/16/2015     multivitamin tablet  Take 1 tablet by mouth daily.     PROBIOTIC DAILY PO  Take by mouth daily.     SUPER OMEGA 3 EPA/DHA 1000 MG Caps  Take by mouth. Takes 2 by mouth at lunch and 2 in the evening.     Thyroid 16.25 MG Tabs  Take by mouth daily.  Vitamin D3 5000 units Caps  Take by mouth daily.     VITAMIN K2 PO  Take by mouth daily.        ROS: Negative, with the exception of above mentioned in HPI  Objective:  BP 134/76 mmHg  Pulse 89  Temp(Src) 98.8 F (37.1 C)  Resp 20  Wt 161 lb (73.029 kg)  SpO2 97% Body mass index is 26.79 kg/(m^2). Gen: Afebrile. No acute distress. Nontoxic in appearance. Well developed, well nourished. Caucasian female. Appears fatigued HENT: AT. [place]. Bilateral TM visualized, shiny/full, no erythema or bulging. MMM, no oral lesions. Bilateral nares without erythema or swelling. Throat without erythema or exudates. Mild cough on exam. No hoarseness.  Eyes:Pupils Equal Round Reactive to light, Extraocular movements intact,  Conjunctiva without redness, discharge or icterus. Neck/lymp/endocrine: Supple,No  Lymphadenopathy CV: RRR  Chest: CTAB, no wheeze or crackles. Good air movement, normal resp effort.    Abd: Soft. NTND. BS present Skin: No rashes, purpura or petechiae.  Neuro: Normal gait. PERLA. EOMi. Alert. Oriented x3  Assessment/Plan: MANE CONSOLO is a 50 y.o. female present for acute OV  1. Influenza - rest, hydrate, motrin/tylenol.  - oseltamivir (TAMIFLU) 75 MG capsule; Take 1 capsule (75 mg total) by mouth 2 (two) times daily.  Dispense: 10 capsule; Refill: 0 - F/U 1week.    electronically signed by: Felix Pacini, DO  [facility] [place]

## 2015-10-06 ENCOUNTER — Other Ambulatory Visit: Payer: Self-pay | Admitting: Family Medicine

## 2015-10-06 DIAGNOSIS — Z1231 Encounter for screening mammogram for malignant neoplasm of breast: Secondary | ICD-10-CM

## 2015-10-16 ENCOUNTER — Ambulatory Visit (HOSPITAL_BASED_OUTPATIENT_CLINIC_OR_DEPARTMENT_OTHER)
Admission: RE | Admit: 2015-10-16 | Discharge: 2015-10-16 | Disposition: A | Discharge status: Home / Self Care | Payer: BLUE CROSS/BLUE SHIELD | Source: Ambulatory Visit | Attending: Family Medicine | Admitting: Family Medicine

## 2015-10-16 DIAGNOSIS — Z1231 Encounter for screening mammogram for malignant neoplasm of breast: Secondary | ICD-10-CM | POA: Insufficient documentation

## 2015-11-27 ENCOUNTER — Encounter: Payer: BLUE CROSS/BLUE SHIELD | Admitting: Family Medicine

## 2016-03-22 ENCOUNTER — Ambulatory Visit (INDEPENDENT_AMBULATORY_CARE_PROVIDER_SITE_OTHER): Payer: 59 | Admitting: Family Medicine

## 2016-03-22 ENCOUNTER — Encounter: Payer: Self-pay | Admitting: Family Medicine

## 2016-03-22 VITALS — BP 108/62 | HR 73 | Temp 98.1°F | Ht 65.0 in | Wt 167.2 lb

## 2016-03-22 DIAGNOSIS — Z23 Encounter for immunization: Secondary | ICD-10-CM | POA: Diagnosis not present

## 2016-03-22 DIAGNOSIS — Z0000 Encounter for general adult medical examination without abnormal findings: Secondary | ICD-10-CM | POA: Diagnosis not present

## 2016-03-22 DIAGNOSIS — T7840XD Allergy, unspecified, subsequent encounter: Secondary | ICD-10-CM

## 2016-03-22 DIAGNOSIS — E785 Hyperlipidemia, unspecified: Secondary | ICD-10-CM

## 2016-03-22 DIAGNOSIS — K219 Gastro-esophageal reflux disease without esophagitis: Secondary | ICD-10-CM | POA: Diagnosis not present

## 2016-03-22 DIAGNOSIS — E079 Disorder of thyroid, unspecified: Secondary | ICD-10-CM | POA: Diagnosis not present

## 2016-03-22 LAB — COMPREHENSIVE METABOLIC PANEL
ALT: 14 U/L (ref 0–35)
AST: 20 U/L (ref 0–37)
Albumin: 4.5 g/dL (ref 3.5–5.2)
Alkaline Phosphatase: 47 U/L (ref 39–117)
BUN: 9 mg/dL (ref 6–23)
CALCIUM: 9 mg/dL (ref 8.4–10.5)
CHLORIDE: 106 meq/L (ref 96–112)
CO2: 27 mEq/L (ref 19–32)
Creatinine, Ser: 0.95 mg/dL (ref 0.40–1.20)
GFR: 66.26 mL/min (ref >60.00)
Glucose, Bld: 88 mg/dL (ref 70–99)
POTASSIUM: 3.7 meq/L (ref 3.5–5.1)
Sodium: 138 mEq/L (ref 135–145)
Total Bilirubin: 0.7 mg/dL (ref 0.2–1.2)
Total Protein: 7.7 g/dL (ref 6.0–8.3)

## 2016-03-22 LAB — CBC
HEMATOCRIT: 43.7 % (ref 36.0–46.0)
HEMOGLOBIN: 15.2 g/dL — AB (ref 12.0–15.0)
MCHC: 34.7 g/dL (ref 30.0–36.0)
MCV: 87.9 fl (ref 78.0–100.0)
PLATELETS: 285 10*3/uL (ref 150.0–400.0)
RBC: 4.97 Mil/uL (ref 3.87–5.11)
RDW: 12.7 % (ref 11.5–15.5)
WBC: 7.1 10*3/uL (ref 4.0–10.5)

## 2016-03-22 LAB — LIPID PANEL
CHOLESTEROL: 216 mg/dL — AB (ref 0–200)
HDL: 49.3 mg/dL (ref >39.00)
LDL CALC: 143 mg/dL — AB (ref 0–99)
NonHDL: 166.32
TRIGLYCERIDES: 115 mg/dL (ref 0.0–149.0)
Total CHOL/HDL Ratio: 4
VLDL: 23 mg/dL (ref 0.0–40.0)

## 2016-03-22 LAB — TSH: TSH: 1.48 u[IU]/mL (ref 0.35–4.50)

## 2016-03-22 NOTE — Progress Notes (Signed)
Pre visit review using our clinic review tool, if applicable. No additional management support is needed unless otherwise documented below in the visit note. 

## 2016-03-22 NOTE — Patient Instructions (Addendum)
L tryptophan or Melatonin 2-10 mg at bedtime Encouraged good sleep hygiene such as dark, quiet room. No blue/green glowing lights such as computer screens in bedroom. No alcohol or stimulants in evening. Cut down on caffeine as able. Regular exercise is helpful but not just prior to bed time.  NOW probiotic  DASH Eating Plan DASH stands for "Dietary Approaches to Stop Hypertension." The DASH eating plan is a healthy eating plan that has been shown to reduce high blood pressure (hypertension). Additional health benefits may include reducing the risk of type 2 diabetes mellitus, heart disease, and stroke. The DASH eating plan may also help with weight loss. WHAT DO I NEED TO KNOW ABOUT THE DASH EATING PLAN? For the DASH eating plan, you will follow these general guidelines:  Choose foods with a percent daily value for sodium of less than 5% (as listed on the food label).  Use salt-free seasonings or herbs instead of table salt or sea salt.  Check with your health care provider or pharmacist before using salt substitutes.  Eat lower-sodium products, often labeled as "lower sodium" or "no salt added."  Eat fresh foods.  Eat more vegetables, fruits, and low-fat dairy products.  Choose whole grains. Look for the word "whole" as the first word in the ingredient list.  Choose fish and skinless chicken or [place] more often than red meat. Limit fish, poultry, and meat to 6 oz (170 g) each day.  Limit sweets, desserts, sugars, and sugary drinks.  Choose heart-healthy fats.  Limit cheese to 1 oz (28 g) per day.  Eat more home-cooked food and less restaurant, buffet, and fast food.  Limit fried foods.  Cook foods using methods other than frying.  Limit canned vegetables. If you do use them, rinse them well to decrease the sodium.  When eating at a restaurant, ask that your food be prepared with less salt, or no salt if possible. WHAT FOODS CAN I EAT? Seek help from a dietitian for  individual calorie needs. Grains Whole grain or whole wheat bread. Brown rice. Whole grain or whole wheat pasta. Quinoa, bulgur, and whole grain cereals. Low-sodium cereals. Corn or whole wheat flour tortillas. Whole grain cornbread. Whole grain crackers. Low-sodium crackers. Vegetables Fresh or frozen vegetables (raw, steamed, roasted, or grilled). Low-sodium or reduced-sodium tomato and vegetable juices. Low-sodium or reduced-sodium tomato sauce and paste. Low-sodium or reduced-sodium canned vegetables.  Fruits All fresh, canned (in natural juice), or frozen fruits. Meat and Other Protein Products Ground beef (85% or leaner), grass-fed beef, or beef trimmed of fat. Skinless chicken or [place]. Ground chicken or [place]. Pork trimmed of fat. All fish and seafood. Eggs. Dried beans, peas, or lentils. Unsalted nuts and seeds. Unsalted canned beans. Dairy Low-fat dairy products, such as skim or 1% milk, 2% or reduced-fat cheeses, low-fat ricotta or cottage cheese, or plain low-fat yogurt. Low-sodium or reduced-sodium cheeses. Fats and Oils Tub margarines without trans fats. Light or reduced-fat mayonnaise and salad dressings (reduced sodium). Avocado. Safflower, olive, or canola oils. Natural peanut or almond butter. Other Unsalted popcorn and pretzels. The items listed above may not be a complete list of recommended foods or beverages. Contact your dietitian for more options. WHAT FOODS ARE NOT RECOMMENDED? Grains White bread. White pasta. White rice. Refined cornbread. Bagels and croissants. Crackers that contain trans fat. Vegetables Creamed or fried vegetables. Vegetables in a cheese sauce. Regular canned vegetables. Regular canned tomato sauce and paste. Regular tomato and vegetable juices. Fruits Dried fruits. Canned fruit in  light or heavy syrup. Fruit juice. Meat and Other Protein Products Fatty cuts of meat. Ribs, chicken wings, bacon, sausage, bologna, salami, chitterlings, fatback, hot  dogs, bratwurst, and packaged luncheon meats. Salted nuts and seeds. Canned beans with salt. Dairy Whole or 2% milk, cream, half-and-half, and cream cheese. Whole-fat or sweetened yogurt. Full-fat cheeses or blue cheese. Nondairy creamers and whipped toppings. Processed cheese, cheese spreads, or cheese curds. Condiments Onion and garlic salt, seasoned salt, table salt, and sea salt. Canned and packaged gravies. Worcestershire sauce. Tartar sauce. Barbecue sauce. Teriyaki sauce. Soy sauce, including reduced sodium. Steak sauce. Fish sauce. Oyster sauce. Cocktail sauce. Horseradish. Ketchup and mustard. Meat flavorings and tenderizers. Bouillon cubes. Hot sauce. Tabasco sauce. Marinades. Taco seasonings. Relishes. Fats and Oils Butter, stick margarine, lard, shortening, ghee, and bacon fat. Coconut, palm kernel, or palm oils. Regular salad dressings. Other Pickles and olives. Salted popcorn and pretzels. The items listed above may not be a complete list of foods and beverages to avoid. Contact your dietitian for more information. WHERE CAN I FIND MORE INFORMATION? National Heart, Lung, and Blood Institute: CableProm[employer].itwww.nhlbi.nih.gov/health/health-topics/topics/dash/   This information is not intended to replace advice given to you by your health care provider. Make sure you discuss any questions you have with your health care provider.   Document Released: 06/30/2011 Document Revised: 08/01/2014 Document Reviewed: 05/15/2013 Elsevier Interactive Patient Education [employer]2016 Elsevier Inc.

## 2016-04-03 NOTE — Assessment & Plan Note (Signed)
Notes food sensitivites and congestion occurs with gluten intake

## 2016-04-03 NOTE — Assessment & Plan Note (Signed)
Patient encouraged to maintain heart healthy diet, regular exercise, adequate sleep. Consider daily probiotics. Take medications as prescribed. Given and reviewed copy of ACP documents from [place] Secretary of State and encouraged to complete and return 

## 2016-04-03 NOTE — Assessment & Plan Note (Signed)
Encouraged heart healthy diet, increase exercise, avoid trans fats, consider a krill oil cap daily 

## 2016-04-03 NOTE — Progress Notes (Signed)
Patient ID: Kendra Bailey, female   DOB: 07/16/1966, 50 y.o.   MRN: [id number]    Subjective:    Patient ID: Kendra Bailey, female    DOB: 01/08/1966, 50 y.o.   MRN: [id number]  Chief Complaint  Patient presents with  . Annual Exam    HPI Patient is in today for annual preventative physical exam. No recent illness or hospitalization. Notes intermittent congesiton when eating certain foods most notably after eating gluten. Her cycles are monthly and tolerable. Trying to maintain a heart healthy diet and regular exercise. Denies CP/palp/SOB/HA/fevers/GI or GU c/o. Taking meds as prescribed  Past Medical History:  Diagnosis Date  . Allergic state 04/22/2012  . Biceps strain 11/30/2010  . Calf pain 08/12/2011  . Cervical cancer screening 06/18/2012  . Fatigue 11/30/2010  . Gastro-esophageal reflux 10/18/2013  . History of chicken pox 11/30/2010  . History of fibrocystic disease of breast 11/30/2010  . History of strep sore throat 11/30/2010  . Hyperlipidemia 06/18/2012  . Insomnia 11/30/2010  . Knee pain, bilateral 11/30/2010  . Onychomycosis 10/20/2013   Right great toe  . Otitis media of right ear 04/22/2012  . Perimenopause 11/30/2010  . Post-menopause 11/30/2010  . Preventative health care 06/18/2012    No past surgical history on file.  Family History  Problem Relation Age of Onset  . Emphysema Father     smoker  . COPD Father 25    emphysema, smoker  . Hyperlipidemia Father   . Multiple sclerosis Sister   . ADD / ADHD Son     ADHD  . Hypertension Maternal Grandmother   . Alzheimer's disease Maternal Grandmother   . Cancer Paternal Grandmother   . Alzheimer's disease Paternal Grandfather   . Heart disease Mother     cardiomegaly, possible valvular insufficiencyt  . Hyperlipidemia Mother   . Hypertension Mother     Social History   Social History  . Marital status: Married    Spouse name: N/A  . Number of children: 3  . Years of education: N/A   Occupational History    . stay at home mom    Social History Main Topics  . Smoking status: Never Smoker  . Smokeless tobacco: Never Used  . Alcohol use Yes     Comment: occasional- wine  . Drug use: No  . Sexual activity: Yes   Other Topics Concern  . Not on file   Social History Narrative   Lives with husband and 3 children   Works with church   No major restrictions, avoids foods that is sensitive to    Outpatient Medications Prior to Visit  Medication Sig Dispense Refill  . Betaine HCl 300 MG TABS Take by mouth. 1 by mouth before each meal    . Calcium Carbonate-Vit D-Min (CALCIUM 1200 PO) Take by mouth daily.    . Cholecalciferol (VITAMIN D3) 5000 UNITS CAPS Take by mouth daily.    Marland Kitchen DHEA 50 MG CAPS Take by mouth daily.    . Digestive Enzymes (DIGESTIVE ENZYME PO) Take by mouth 3 (three) times daily with meals.    . Menaquinone-7 (VITAMIN K2 PO) Take by mouth daily.    . multivitamin (THERAGRAN) tablet Take 1 tablet by mouth daily.      . Omega-3 Fatty Acids (SUPER OMEGA 3 EPA/DHA) 1000 MG CAPS Take by mouth. Takes 2 by mouth at lunch and 2 in the evening.    . Probiotic Product (PROBIOTIC DAILY PO) Take by mouth daily.    Marland Kitchen  Thyroid 16.25 MG TABS Take by mouth daily.    Marland Kitchen albuterol (PROVENTIL HFA;VENTOLIN HFA) 108 (90 BASE) MCG/ACT inhaler Inhale 2 puffs into the lungs every 6 (six) hours as needed for wheezing or shortness of breath. (Patient not taking: Reported on 09/16/2015) 1 Inhaler 2  . fluticasone (FLONASE) 50 MCG/ACT nasal spray Reported on 09/16/2015  3  . oseltamivir (TAMIFLU) 75 MG capsule Take 1 capsule (75 mg total) by mouth 2 (two) times daily. 10 capsule 0   No facility-administered medications prior to visit.     Allergies  Allergen Reactions  . Penicillins Rash    Review of Systems  Constitutional: Negative for chills, fever and malaise/fatigue.  HENT: Positive for congestion. Negative for hearing loss.   Eyes: Negative for discharge.  Respiratory: Negative for cough,  sputum production and shortness of breath.   Cardiovascular: Negative for chest pain, palpitations and leg swelling.  Gastrointestinal: Negative for abdominal pain, blood in stool, constipation, diarrhea, heartburn, nausea and vomiting.  Genitourinary: Negative for dysuria, frequency, hematuria and urgency.  Musculoskeletal: Negative for back pain, falls and myalgias.  Skin: Negative for rash.  Neurological: Positive for weakness. Negative for dizziness, sensory change, loss of consciousness and headaches.  Endo/Heme/Allergies: Negative for environmental allergies. Does not bruise/bleed easily.  Psychiatric/Behavioral: Negative for depression and suicidal ideas. The patient is not nervous/anxious and does not have insomnia.        Objective:    Physical Exam  Constitutional: She is oriented to person, place, and time. She appears well-developed and well-nourished. No distress.  HENT:  Head: Normocephalic and atraumatic.  Eyes: Conjunctivae are normal.  Neck: Neck supple. No thyromegaly present.  Cardiovascular: Normal rate, regular rhythm and normal heart sounds.   No murmur heard. Pulmonary/Chest: Effort normal and breath sounds normal. No respiratory distress.  Abdominal: Soft. Bowel sounds are normal. She exhibits no distension and no mass. There is no tenderness.  Musculoskeletal: She exhibits no edema.  Lymphadenopathy:    She has no cervical adenopathy.  Neurological: She is alert and oriented to person, place, and time.  Skin: Skin is warm and dry.  Psychiatric: She has a normal mood and affect. Her behavior is normal.    BP 108/62 (BP Location: Left Arm, Patient Position: Sitting, Cuff Size: Normal)   Pulse 73   Temp 98.1 F (36.7 C) (Oral)   Ht 5\' 5"  (1.651 m)   Wt 167 lb 4 oz (75.9 kg)   SpO2 98%   BMI 27.83 kg/m  Wt Readings from Last 3 Encounters:  03/22/16 167 lb 4 oz (75.9 kg)  09/16/15 161 lb (73 kg)  07/13/15 163 lb (73.9 kg)     Lab Results  Component  Value Date   WBC 7.1 03/22/2016   HGB 15.2 (H) 03/22/2016   HCT 43.7 03/22/2016   PLT 285.0 03/22/2016   GLUCOSE 88 03/22/2016   CHOL 216 (H) 03/22/2016   TRIG 115.0 03/22/2016   HDL 49.30 03/22/2016   LDLDIRECT 143.3 06/13/2012   LDLCALC 143 (H) 03/22/2016   ALT 14 03/22/2016   AST 20 03/22/2016   NA 138 03/22/2016   K 3.7 03/22/2016   CL 106 03/22/2016   CREATININE 0.95 03/22/2016   BUN 9 03/22/2016   CO2 27 03/22/2016   TSH 1.48 03/22/2016    Lab Results  Component Value Date   TSH 1.48 03/22/2016   Lab Results  Component Value Date   WBC 7.1 03/22/2016   HGB 15.2 (H) 03/22/2016   HCT  43.7 03/22/2016   MCV 87.9 03/22/2016   PLT 285.0 03/22/2016   Lab Results  Component Value Date   NA 138 03/22/2016   K 3.7 03/22/2016   CO2 27 03/22/2016   GLUCOSE 88 03/22/2016   BUN 9 03/22/2016   CREATININE 0.95 03/22/2016   BILITOT 0.7 03/22/2016   ALKPHOS 47 03/22/2016   AST 20 03/22/2016   ALT 14 03/22/2016   PROT 7.7 03/22/2016   ALBUMIN 4.5 03/22/2016   CALCIUM 9.0 03/22/2016   GFR 66.26 03/22/2016   Lab Results  Component Value Date   CHOL 216 (H) 03/22/2016   Lab Results  Component Value Date   HDL 49.30 03/22/2016   Lab Results  Component Value Date   LDLCALC 143 (H) 03/22/2016   Lab Results  Component Value Date   TRIG 115.0 03/22/2016   Lab Results  Component Value Date   CHOLHDL 4 03/22/2016   No results found for: HGBA1C     Assessment & Plan:   Problem List Items Addressed This Visit    Allergic state    Notes food sensitivites and congestion occurs with gluten intake      Hyperlipidemia    Encouraged heart healthy diet, increase exercise, avoid trans fats, consider a krill oil cap daily      Preventative health care - Primary    Patient encouraged to maintain heart healthy diet, regular exercise, adequate sleep. Consider daily probiotics. Take medications as prescribed. Given and reviewed copy of ACP documents from [employer] Secretary  of [place] and encouraged to complete and return      Relevant Orders   CBC (Completed)   Comprehensive metabolic panel (Completed)   Lipid panel (Completed)   Gastro-esophageal reflux    Avoid offending foods, start probiotics. Do not eat large meals in late evening and consider raising head of bed.        Other Visit Diagnoses    Encounter for immunization       Relevant Orders   Flu Vaccine QUAD 36+ mos IM (Completed)   Thyroid disease       Relevant Medications   thyroid (NATURE-THROID) 32.5 MG tablet   Other Relevant Orders   TSH (Completed)      I have discontinued Ms. Logie's fluticasone, albuterol, and oseltamivir. I am also having her maintain her multivitamin, Calcium Carbonate-Vit D-Min (CALCIUM 1200 PO), SUPER OMEGA 3 EPA/DHA, Betaine HCl, Digestive Enzymes (DIGESTIVE ENZYME PO), Probiotic Product (PROBIOTIC DAILY PO), DHEA, Vitamin D3, Menaquinone-7 (VITAMIN K2 PO), Thyroid, and thyroid.  Meds ordered this encounter  Medications  . thyroid (NATURE-THROID) 32.5 MG tablet    Sig: Take 32.5 tablets by mouth daily.     Danise EdgeBLYTH, Makayela Secrest, MD

## 2016-04-03 NOTE — Assessment & Plan Note (Signed)
Avoid offending foods, start probiotics. Do not eat large meals in late evening and consider raising head of bed.  

## 2016-09-23 ENCOUNTER — Ambulatory Visit: Payer: 59 | Admitting: Family Medicine

## 2016-10-27 IMAGING — DX DG CHEST 2V
2 series · 2 of 2 positions shown · non-contrast
Comparison: None.

CLINICAL DATA: Patient c/o cough, congestion, recent diagnoses of
pneumonia, non-smoker, no other complaints

EXAM:
CHEST  2 VIEW

[chest pa]
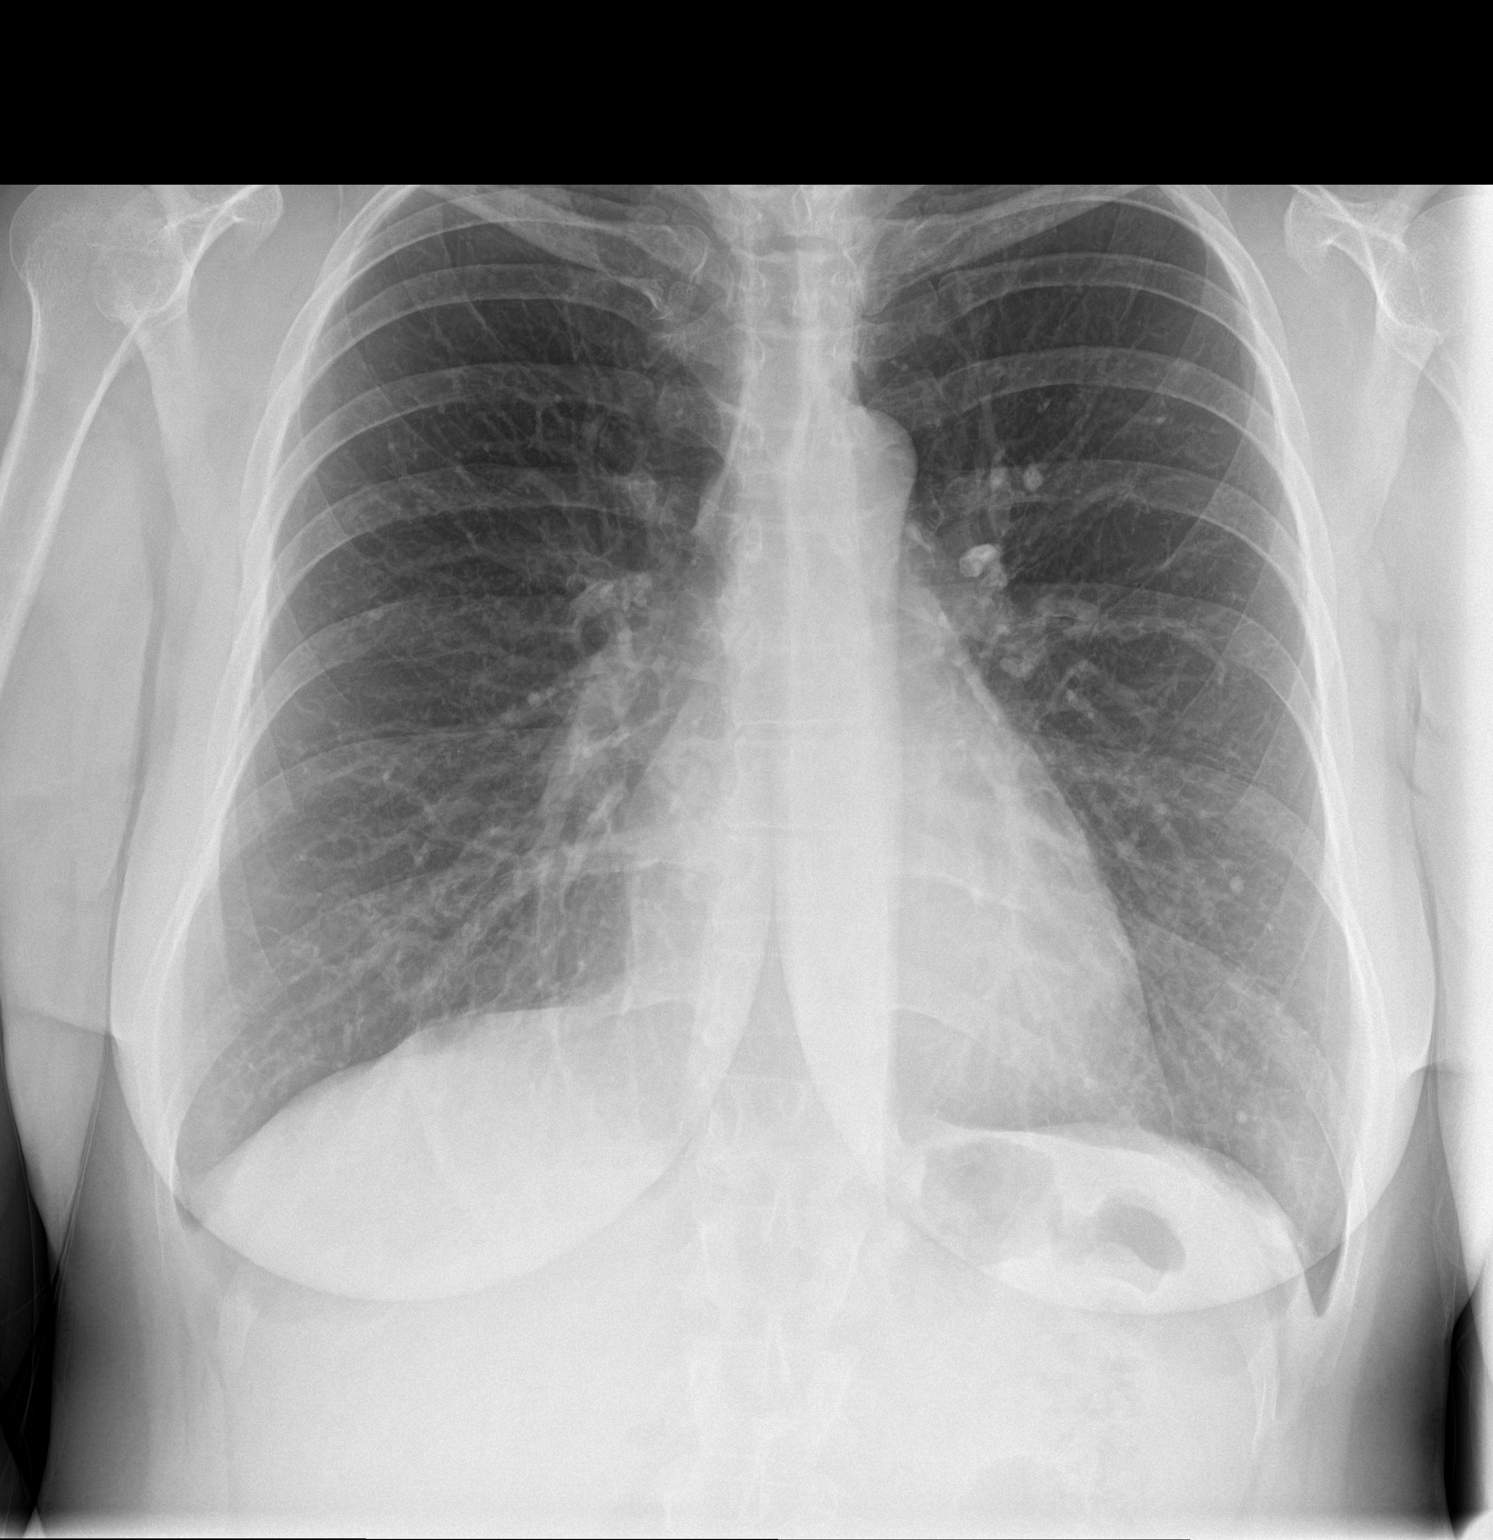

[chest lat]
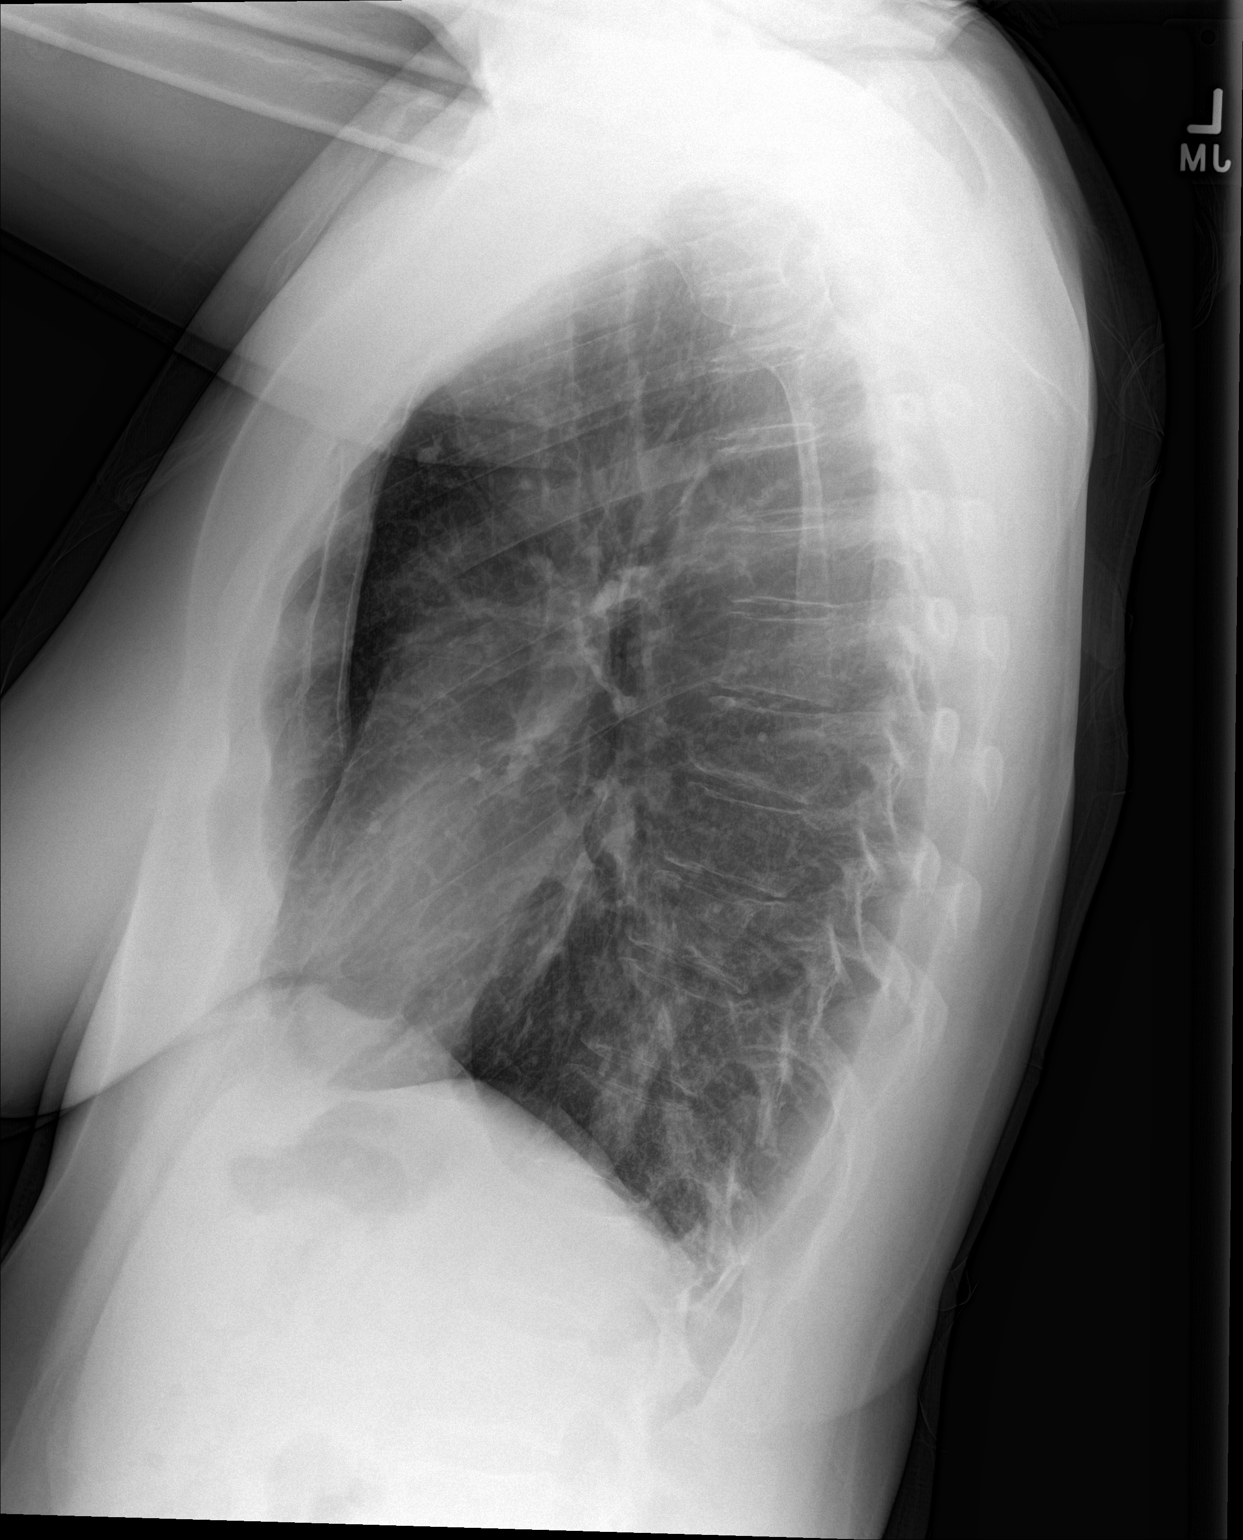

[2 of 2 positions shown; findings below may reference images not displayed]

FINDINGS: A mild pectus excavatum deformity. Midline trachea. Normal heart
size and mediastinal contours. No pleural effusion or pneumothorax.
Retrosternal density on lateral view likely corresponds to a medial
left upper lobe density on the frontal and is consistent with a
calcified granuloma. There are also small left lower lobe calcified
granulomas. No suspicious pulmonary nodule or consolidation.
IMPRESSION: No acute cardiopulmonary disease.

## 2017-04-21 ENCOUNTER — Telehealth: Payer: Self-pay | Admitting: Family Medicine

## 2017-04-21 NOTE — Telephone Encounter (Signed)
Ok to schedule.

## 2017-04-21 NOTE — Telephone Encounter (Signed)
Pt would like to transfer care from Dr. Blyth tAbner GreenspanDr. Beverely Low. Please advise ok to schedule.

## 2017-04-23 NOTE — Telephone Encounter (Signed)
OK with me.

## 2017-04-25 NOTE — Telephone Encounter (Signed)
LM for CB to schedule appt with KT.

## 2019-06-11 ENCOUNTER — Other Ambulatory Visit: Payer: Self-pay

## 2019-06-11 DIAGNOSIS — Z20822 Contact with and (suspected) exposure to covid-19: Secondary | ICD-10-CM

## 2019-06-13 LAB — NOVEL CORONAVIRUS, NAA: SARS-CoV-2, NAA: NOT DETECTED

## 2020-07-31 ENCOUNTER — Ambulatory Visit: Payer: 59 | Admitting: Family Medicine

## 2020-09-08 ENCOUNTER — Telehealth: Payer: Self-pay

## 2020-09-08 NOTE — Telephone Encounter (Signed)
Patient is scheduled to establish care in August.  Would like to know if she can get in sooner due to keeping routine check on her cholesterol.  Please advise.

## 2020-09-08 NOTE — Telephone Encounter (Signed)
That is fine, just find a spot! Aw

## 2020-09-17 NOTE — Telephone Encounter (Signed)
Left patient vm to call back in regard to get appt moved up.

## 2020-10-08 ENCOUNTER — Ambulatory Visit: Payer: BC Managed Care – PPO | Admitting: Family Medicine

## 2020-11-19 ENCOUNTER — Ambulatory Visit: Payer: BC Managed Care – PPO | Admitting: Family Medicine

## 2021-03-22 ENCOUNTER — Ambulatory Visit: Payer: Self-pay | Admitting: Family Medicine

## 2024-03-26 ENCOUNTER — Ambulatory Visit: Admitting: Family Medicine

## 2024-03-26 ENCOUNTER — Encounter: Payer: Self-pay | Admitting: Family Medicine

## 2024-03-26 VITALS — BP 111/77 | HR 74 | Temp 97.0°F | Resp 18 | Ht 63.78 in | Wt 165.0 lb

## 2024-03-26 DIAGNOSIS — E039 Hypothyroidism, unspecified: Secondary | ICD-10-CM

## 2024-03-26 DIAGNOSIS — Z0000 Encounter for general adult medical examination without abnormal findings: Secondary | ICD-10-CM

## 2024-03-26 NOTE — Progress Notes (Signed)
 New Patient Office Visit  Subjective:  Patient ID: Kendra Bailey, female    DOB: 1966-04-23  Age: 58 y.o. MRN: [id number]  CC:  Chief Complaint  Patient presents with   Establish Care    Initial visit to establish care with new pcp Need refill on thyroid  medication    HPI Kendra Bailey presents for new pt.  Hyprothyroidism Dx: [place] Discussed the use of AI scribe software for clinical note transcription with the patient, who gave verbal consent to proceed.  History of Present Illness Kendra Bailey is a 58 year old female who presents for a new patient visit and management of thyroid  medication.  She has a history of thyroid  issues and has been taking Nature-Throid for several years. Her thyroid  levels were last checked six months ago. She believes her current dose might be 32.5 mg once daily. She plans to verify the dose at home. She denies heart racing, shakiness, jitteriness, diarrhea, or significant hair loss.  She has been taking over-the-counter DHEA and red yeast rice. She wants to manage her cholesterol through diet and exercise alone. Her cholesterol levels were slightly elevated in August 2023 but were normal the previous year. She exercises regularly and is focused on maintaining her health and energy levels.  She has a history of knots on her right foot and has been doing therapy exercises since May 2025 after consulting with a podiatrist. The knots have decreased in size but have not completely resolved.  No heart racing, shakiness, jitteriness, diarrhea, significant hair loss, coughing, shortness of breath, or muscle aches. She experiences stress and anxiety related to her children but is working on managing these feelings.      Current Outpatient Medications:    DHEA 50 MG CAPS, Take by mouth daily., Disp: , Rfl:    MAGNESIUM  GLYCINATE PO, Take by mouth daily., Disp: , Rfl:    Multiple Vitamin (MULTIVITAMINS PO), Take by mouth., Disp: , Rfl:     PREGNENOLONE MICRONIZED PO, Take by mouth., Disp: , Rfl:    Probiotic Product (PROBIOTIC DAILY PO), Take by mouth daily., Disp: , Rfl:    Red Yeast Rice Extract 600 MG CAPS, Take by mouth., Disp: , Rfl:    thyroid  (NATURE-THROID) 32.5 MG tablet, Take 32.5 tablets by mouth daily. (Patient taking differently: Take 60 tablets by mouth daily. Nature Thyroid ), Disp: , Rfl:   Past Medical History:  Diagnosis Date   Allergic state 04/22/2012   Arthritis    Biceps strain 11/30/2010   Calf pain 08/12/2011   Cervical cancer screening 06/18/2012   Fatigue 11/30/2010   Gastro-esophageal reflux 10/18/2013   History of chicken pox 11/30/2010   History of fibrocystic disease of breast 11/30/2010   History of strep sore throat 11/30/2010   Hyperlipidemia 06/18/2012   Hypothyroidism (acquired)    Insomnia 11/30/2010   Knee pain, bilateral 11/30/2010   Onychomycosis 10/20/2013   Right great toe   Otitis media of right ear 04/22/2012   Perimenopause 11/30/2010   Post-menopause 11/30/2010   Preventative health care 06/18/2012    History reviewed. No pertinent surgical history.  Family History  Problem Relation Age of Onset   Heart disease Mother        cardiomegaly, possible valvular insufficiencyt   Hyperlipidemia Mother    Hypertension Mother    Emphysema Father        smoker   COPD Father 62       emphysema, smoker   Hyperlipidemia Father  Multiple sclerosis Sister    ADD / ADHD Son        ADHD   Hypertension Maternal Grandmother    Alzheimer's disease Maternal Grandmother    Cancer Paternal Grandmother        unk   Alzheimer's disease Paternal Grandfather     Social History   Socioeconomic History   Marital status: Married    Spouse name: Not on file   Number of children: 3   Years of education: Not on file   Highest education level: Not on file  Occupational History   Occupation: stay at home mom  Tobacco Use   Smoking status: Never   Smokeless tobacco: Never   Vaping Use   Vaping status: Never Used  Substance and Sexual Activity   Alcohol use: Yes    Alcohol/week: 1.0 - 2.0 standard drink of alcohol    Types: 1 - 2 Glasses of wine per week    Comment: occasional- wine   Drug use: No   Sexual activity: Yes  Other Topics Concern   Not on file  Social History Narrative   Lives with husband and 3 children      No major restrictions, avoids foods that is sensitive to   Social Drivers of Health   Financial Resource Strain: Low Risk  (10/26/2023)   Received from [facility]   Overall Financial Resource Strain (CARDIA)    Difficulty of Paying Living Expenses: Not very hard  Food Insecurity: No Food Insecurity (10/26/2023)   Received from [facility]   Hunger Vital Sign    Within the past 12 months, you worried that your food would run out before you got the money to buy more.: Never true    Within the past 12 months, the food you bought just didn't last and you didn't have money to get more.: Never true  Transportation Needs: No Transportation Needs (10/26/2023)   Received from [facility] - Transportation    Lack of Transportation (Medical): No    Lack of Transportation (Non-Medical): No  Physical Activity: Insufficiently Active (10/26/2023)   Received from [facility]   Exercise Vital Sign    On average, how many days per week do you engage in moderate to strenuous exercise (like a brisk walk)?: 2 days    On average, how many minutes do you engage in exercise at this level?: 40 min  Stress: Stress Concern Present (10/26/2023)   Received from [facility] - Occupational Stress Questionnaire    Feeling of Stress : To some extent  Social Connections: Moderately Integrated (10/26/2023)   Received from [facility]   Social Network    How would you rate your social network (family, work, friends)?: Adequate participation with social networks  Intimate Partner Violence: Not At Risk  (10/26/2023)   Received from [facility]   HITS    Over the last 12 months how often did your partner physically hurt you?: Never    Over the last 12 months how often did your partner insult you or talk down to you?: Never    Over the last 12 months how often did your partner threaten you with physical harm?: Never    Over the last 12 months how often did your partner scream or curse at you?: Never    ROS  ROS: Gen: no fever, chills  Skin: no rash, itching ENT: no ear pain, ear drainage, nasal congestion, rhinorrhea, sinus pressure,  sore throat Eyes: no blurry vision, double vision Resp: no cough, wheeze,SOB CV: no CP, palpitations, LE edema,  GI: no heartburn, n/v/d/c, abd pain GU: no dysuria, urgency, frequency, hematuria MSK: no joint pain, myalgias, back pain Neuro: no dizziness, headache, weakness, vertigo Psych: no depression, anxiety, insomnia, SI   Objective:   Today's Vitals: BP 111/77   Pulse 74   Temp (!) 97 F (36.1 C) (Temporal)   Resp 18   Ht 5' 3.78 (1.62 m)   Wt 165 lb (74.8 kg)   SpO2 98%   BMI 28.52 kg/m   Physical Exam  Gen: WDWN NAD HEENT: NCAT, conjunctiva not injected, sclera nonicteric NECK:  supple, no thyromegaly, no nodes, no carotid bruits CARDIAC: RRR, S1S2+, no murmur. DP 2+B LUNGS: CTAB. No wheezes ABDOMEN:  BS+, soft, NTND, No HSM, no masses EXT:  no edema MSK: no gross abnormalities.  NEURO: A&O x3.  CN II-XII intact.  PSYCH: normal mood. Good eye contact   Reviewed [facility] records  Assessment & Plan:  Acquired hypothyroidism -     TSH; Future -     T3, free; Future -     T4, free; Future  Wellness examination -     Lipid panel; Future -     Comprehensive metabolic panel with GFR; Future -     CBC with Differential/Platelet; Future -     Hemoglobin A1c; Future -     TSH; Future -     T3, free; Future -     T4, free; Future  Assessment and Plan Assessment & Plan Hypothyroidism   Hypothyroidism is managed with natural  thyroid  medication. Her last thyroid  level check was six months ago, and the current dose of medication needs verification. Refill the current thyroid  medication and verify the dose at home. Schedule a follow-up in a few months for a physical exam and comprehensive blood work, including thyroid  levels.  Hyperlipidemia   Hyperlipidemia was managed with red yeast rice, similar to a statin, but there are concerns about its long-term effects. Previous cholesterol levels were slightly elevated in August 2023 but were normal the year before. She prefers to manage cholesterol with diet and exercise, and there is no outcomes data for red yeast rice in preventing cardiovascular events. Discontinue red yeast rice and continue the diet and exercise regimen. Schedule a follow-up in a few months for a physical exam and comprehensive blood work to reassess cholesterol levels.  Right foot tendon nodules   Right foot tendon nodules have decreased in size with therapy exercises since the initial consultation with a podiatrist in May 2025. The nodules are smaller but not completely resolved. Continue therapy exercises and monitor progress, reassessing if necessary.  Did not do annual physical-dx only used to order labs for December    Follow-up: Return in about 3 months (around 06/25/2024) for annual physical.   Jenkins CHRISTELLA Carrel, MD

## 2024-03-26 NOTE — Patient Instructions (Addendum)
 Welcome to [facility]! It was a pleasure meeting you today.  As discussed, Please schedule a 3 month follow up visit today. Stop the red yeast rice  Call with dose of thyroid   No vitamins few days before labs  PLEASE NOTE:  If you had any LAB tests please let us  know if you have not heard back within a few days. You may see your results on MyChart before we have a chance to review them but we will give you a call once they are reviewed by us . If we ordered any REFERRALS today, please let us  know if you have not heard from their office within the next week.  Let us  know through MyChart if you are needing REFILLS, or have your pharmacy send us  the request. You can also use MyChart to communicate with me or any office staff.  Please try these tips to maintain a healthy lifestyle:  Eat most of your calories during the day when you are active. Eliminate processed foods including packaged sweets (pies, cakes, cookies), reduce intake of potatoes, white bread, white pasta, and white rice. Look for whole grain options, oat flour or almond flour.  Each meal should contain half fruits/vegetables, one quarter protein, and one quarter carbs (no bigger than a computer mouse).  Cut down on sweet beverages. This includes juice, soda, and sweet tea. Also watch fruit intake, though this is a healthier sweet option, it still contains natural sugar! Limit to 3 servings daily.  Drink at least 1 glass of water with each meal and aim for at least 8 glasses per day  Exercise at least 150 minutes every week.

## 2024-05-02 ENCOUNTER — Encounter: Payer: Self-pay | Admitting: Family Medicine

## 2024-05-03 ENCOUNTER — Other Ambulatory Visit: Payer: Self-pay

## 2024-05-03 DIAGNOSIS — E039 Hypothyroidism, unspecified: Secondary | ICD-10-CM

## 2024-05-03 NOTE — Telephone Encounter (Signed)
 Future orders have been placed for Dec per last OV note. Please advise if patient can come in to just test her thyroid  levels now.

## 2024-05-08 ENCOUNTER — Ambulatory Visit: Payer: Self-pay | Admitting: Family Medicine

## 2024-05-08 ENCOUNTER — Ambulatory Visit: Payer: Self-pay | Admitting: Family

## 2024-05-08 ENCOUNTER — Other Ambulatory Visit (INDEPENDENT_AMBULATORY_CARE_PROVIDER_SITE_OTHER)

## 2024-05-08 DIAGNOSIS — Z0000 Encounter for general adult medical examination without abnormal findings: Secondary | ICD-10-CM

## 2024-05-08 DIAGNOSIS — E039 Hypothyroidism, unspecified: Secondary | ICD-10-CM

## 2024-05-08 LAB — CBC WITH DIFFERENTIAL/PLATELET
Basophils Absolute: 0.1 K/uL (ref 0.0–0.1)
Basophils Relative: 0.9 % (ref 0.0–3.0)
Eosinophils Absolute: 0.1 K/uL (ref 0.0–0.7)
Eosinophils Relative: 1.9 % (ref 0.0–5.0)
HCT: 44 % (ref 36.0–46.0)
Hemoglobin: 14.9 g/dL (ref 12.0–15.0)
Lymphocytes Relative: 31.3 % (ref 12.0–46.0)
Lymphs Abs: 1.8 K/uL (ref 0.7–4.0)
MCHC: 34 g/dL (ref 30.0–36.0)
MCV: 88.2 fl (ref 78.0–100.0)
Monocytes Absolute: 0.5 K/uL (ref 0.1–1.0)
Monocytes Relative: 8.1 % (ref 3.0–12.0)
Neutro Abs: 3.2 K/uL (ref 1.4–7.7)
Neutrophils Relative %: 57.8 % (ref 43.0–77.0)
Platelets: 279 K/uL (ref 150.0–400.0)
RBC: 4.99 Mil/uL (ref 3.87–5.11)
RDW: 12.5 % (ref 11.5–15.5)
WBC: 5.6 K/uL (ref 4.0–10.5)

## 2024-05-08 LAB — COMPREHENSIVE METABOLIC PANEL WITH GFR
ALT: 16 U/L (ref 0–35)
AST: 23 U/L (ref 0–37)
Albumin: 4.5 g/dL (ref 3.5–5.2)
Alkaline Phosphatase: 63 U/L (ref 39–117)
BUN: 14 mg/dL (ref 6–23)
CO2: 27 meq/L (ref 19–32)
Calcium: 9.4 mg/dL (ref 8.4–10.5)
Chloride: 105 meq/L (ref 96–112)
Creatinine, Ser: 0.95 mg/dL (ref 0.40–1.20)
GFR: 66.35 mL/min (ref >60.00)
Glucose, Bld: 89 mg/dL (ref 70–99)
Potassium: 4.1 meq/L (ref 3.5–5.1)
Sodium: 140 meq/L (ref 135–145)
Total Bilirubin: 0.7 mg/dL (ref 0.2–1.2)
Total Protein: 7.4 g/dL (ref 6.0–8.3)

## 2024-05-08 LAB — LIPID PANEL
Cholesterol: 178 mg/dL (ref 0–200)
HDL: 48.5 mg/dL (ref >39.00)
LDL Cholesterol: 109 mg/dL — ABNORMAL HIGH (ref 0–99)
NonHDL: 129.37
Total CHOL/HDL Ratio: 4
Triglycerides: 102 mg/dL (ref 0.0–149.0)
VLDL: 20.4 mg/dL (ref 0.0–40.0)

## 2024-05-08 LAB — T3, FREE: T3, Free: 3.7 pg/mL (ref 2.3–4.2)

## 2024-05-08 LAB — TSH: TSH: 1.11 u[IU]/mL (ref 0.35–5.50)

## 2024-05-08 LAB — HEMOGLOBIN A1C: Hgb A1c MFr Bld: 5.3 % (ref 4.6–6.5)

## 2024-05-08 LAB — T4, FREE: Free T4: 0.7 ng/dL (ref 0.60–1.60)

## 2024-05-08 NOTE — Progress Notes (Signed)
 Labs look great.  Same meds/doses

## 2024-06-12 ENCOUNTER — Ambulatory Visit: Admitting: Family Medicine

## 2024-06-25 ENCOUNTER — Encounter: Payer: Self-pay | Admitting: Family Medicine

## 2024-06-25 ENCOUNTER — Other Ambulatory Visit: Payer: Self-pay

## 2024-06-25 ENCOUNTER — Other Ambulatory Visit: Payer: Self-pay | Admitting: Family

## 2024-06-27 ENCOUNTER — Other Ambulatory Visit: Payer: Self-pay | Admitting: Family

## 2024-06-27 MED ORDER — THYROID 60 MG PO TABS: 60.0000 mg | ORAL_TABLET | Freq: Every day | ORAL | 1 refills | Status: AC

## 2024-07-15 ENCOUNTER — Other Ambulatory Visit

## 2024-07-30 ENCOUNTER — Encounter: Admitting: Family Medicine
